# Patient Record
Sex: Female | Born: 1980 | Race: Asian | Hispanic: No | Marital: Married | Smoking: Current some day smoker
Health system: Southern US, Community
[De-identification: ages and names within clinical notes are randomized; demographics above are authoritative.]

## PROBLEM LIST (undated history)

## (undated) DIAGNOSIS — Z789 Other specified health status: Secondary | ICD-10-CM

## (undated) HISTORY — PX: NO PAST SURGERIES: SHX2092

---

## 1999-03-21 ENCOUNTER — Emergency Department (HOSPITAL_COMMUNITY): Admission: EM | Admit: 1999-03-21 | Discharge: 1999-03-21 | Payer: Self-pay | Admitting: Emergency Medicine

## 2002-06-09 ENCOUNTER — Encounter: Payer: Self-pay | Admitting: *Deleted

## 2002-06-09 ENCOUNTER — Ambulatory Visit (HOSPITAL_COMMUNITY): Admission: RE | Admit: 2002-06-09 | Discharge: 2002-06-09 | Payer: Self-pay | Admitting: *Deleted

## 2002-06-11 ENCOUNTER — Encounter: Admission: RE | Admit: 2002-06-11 | Discharge: 2002-06-11 | Payer: Self-pay | Admitting: *Deleted

## 2002-07-02 ENCOUNTER — Encounter: Admission: RE | Admit: 2002-07-02 | Discharge: 2002-07-02 | Payer: Self-pay | Admitting: *Deleted

## 2002-07-23 ENCOUNTER — Encounter: Admission: RE | Admit: 2002-07-23 | Discharge: 2002-07-23 | Payer: Self-pay | Admitting: *Deleted

## 2002-08-06 ENCOUNTER — Encounter: Admission: RE | Admit: 2002-08-06 | Discharge: 2002-08-06 | Payer: Self-pay | Admitting: *Deleted

## 2002-08-06 ENCOUNTER — Encounter (HOSPITAL_COMMUNITY): Admission: RE | Admit: 2002-08-06 | Discharge: 2002-09-05 | Payer: Self-pay | Admitting: *Deleted

## 2002-08-20 ENCOUNTER — Encounter: Admission: RE | Admit: 2002-08-20 | Discharge: 2002-08-20 | Payer: Self-pay | Admitting: *Deleted

## 2002-08-27 ENCOUNTER — Encounter: Admission: RE | Admit: 2002-08-27 | Discharge: 2002-08-27 | Payer: Self-pay | Admitting: *Deleted

## 2002-09-10 ENCOUNTER — Encounter: Admission: RE | Admit: 2002-09-10 | Discharge: 2002-09-10 | Payer: Self-pay | Admitting: *Deleted

## 2002-09-10 ENCOUNTER — Inpatient Hospital Stay (HOSPITAL_COMMUNITY): Admission: AD | Admit: 2002-09-10 | Discharge: 2002-09-17 | Payer: Self-pay | Admitting: *Deleted

## 2002-09-14 ENCOUNTER — Encounter: Payer: Self-pay | Admitting: Obstetrics and Gynecology

## 2002-09-24 ENCOUNTER — Encounter: Admission: RE | Admit: 2002-09-24 | Discharge: 2002-09-24 | Payer: Self-pay | Admitting: *Deleted

## 2002-10-08 ENCOUNTER — Encounter: Admission: RE | Admit: 2002-10-08 | Discharge: 2002-10-08 | Payer: Self-pay | Admitting: *Deleted

## 2002-10-17 ENCOUNTER — Observation Stay (HOSPITAL_COMMUNITY): Admission: AD | Admit: 2002-10-17 | Discharge: 2002-10-18 | Payer: Self-pay | Admitting: *Deleted

## 2002-10-17 ENCOUNTER — Encounter: Payer: Self-pay | Admitting: *Deleted

## 2002-10-19 ENCOUNTER — Inpatient Hospital Stay (HOSPITAL_COMMUNITY): Admission: AD | Admit: 2002-10-19 | Discharge: 2002-10-21 | Payer: Self-pay | Admitting: *Deleted

## 2005-09-12 ENCOUNTER — Ambulatory Visit (HOSPITAL_COMMUNITY): Admission: RE | Admit: 2005-09-12 | Discharge: 2005-09-12 | Payer: Self-pay | Admitting: *Deleted

## 2005-09-20 ENCOUNTER — Ambulatory Visit: Payer: Self-pay | Admitting: Family Medicine

## 2005-10-02 ENCOUNTER — Inpatient Hospital Stay (HOSPITAL_COMMUNITY): Admission: AD | Admit: 2005-10-02 | Discharge: 2005-10-02 | Payer: Self-pay | Admitting: *Deleted

## 2005-10-11 ENCOUNTER — Ambulatory Visit (HOSPITAL_COMMUNITY): Admission: RE | Admit: 2005-10-11 | Discharge: 2005-10-11 | Payer: Self-pay | Admitting: *Deleted

## 2005-10-11 ENCOUNTER — Ambulatory Visit: Payer: Self-pay | Admitting: Family Medicine

## 2005-10-25 ENCOUNTER — Ambulatory Visit: Payer: Self-pay | Admitting: *Deleted

## 2005-11-22 ENCOUNTER — Ambulatory Visit: Payer: Self-pay | Admitting: *Deleted

## 2005-12-12 ENCOUNTER — Ambulatory Visit: Payer: Self-pay | Admitting: Obstetrics & Gynecology

## 2005-12-19 ENCOUNTER — Ambulatory Visit: Payer: Self-pay | Admitting: Obstetrics & Gynecology

## 2005-12-19 ENCOUNTER — Ambulatory Visit (HOSPITAL_COMMUNITY): Admission: RE | Admit: 2005-12-19 | Discharge: 2005-12-19 | Payer: Self-pay | Admitting: Obstetrics & Gynecology

## 2005-12-26 ENCOUNTER — Ambulatory Visit: Payer: Self-pay | Admitting: *Deleted

## 2005-12-26 ENCOUNTER — Inpatient Hospital Stay (HOSPITAL_COMMUNITY): Admission: AD | Admit: 2005-12-26 | Discharge: 2005-12-28 | Payer: Self-pay | Admitting: *Deleted

## 2006-01-02 ENCOUNTER — Ambulatory Visit: Payer: Self-pay | Admitting: *Deleted

## 2006-01-09 ENCOUNTER — Ambulatory Visit: Payer: Self-pay | Admitting: Obstetrics & Gynecology

## 2006-01-23 ENCOUNTER — Ambulatory Visit: Payer: Self-pay | Admitting: Family Medicine

## 2006-02-06 ENCOUNTER — Ambulatory Visit: Payer: Self-pay | Admitting: *Deleted

## 2006-02-14 ENCOUNTER — Ambulatory Visit: Payer: Self-pay | Admitting: Gynecology

## 2006-02-21 ENCOUNTER — Ambulatory Visit: Payer: Self-pay | Admitting: *Deleted

## 2006-02-21 IMAGING — US US OB FOLLOW-UP
1 series · 13 of 28 positions shown · non-contrast
Comparison: none

CLINICAL DATA: Anatomic exam.  No current problems.

[Series 1: us ob follow-up · 0.16mm/px · 13 of 86 slices shown]
[im 4/86]
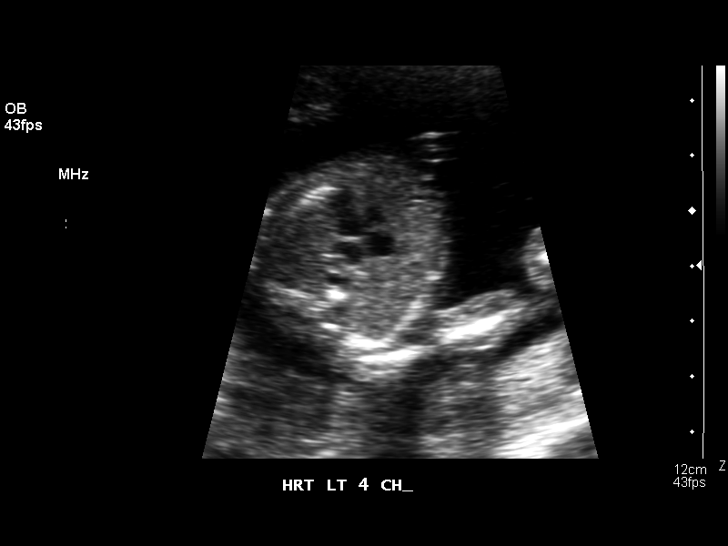
[im 10/86]
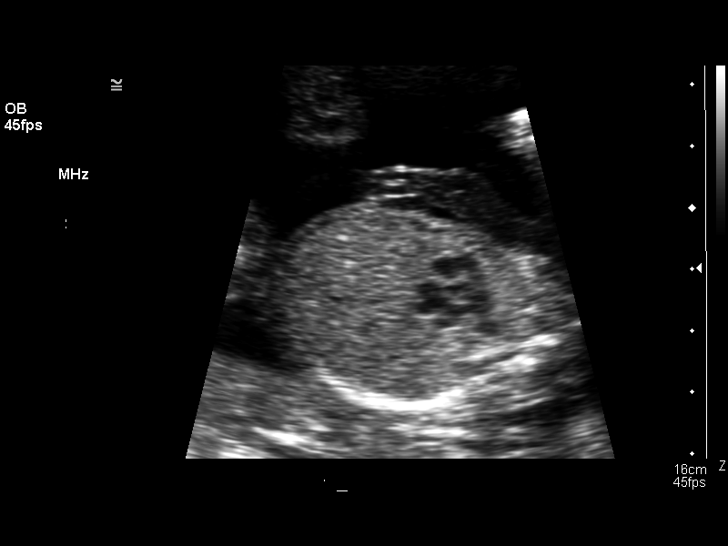
[im 16/86]
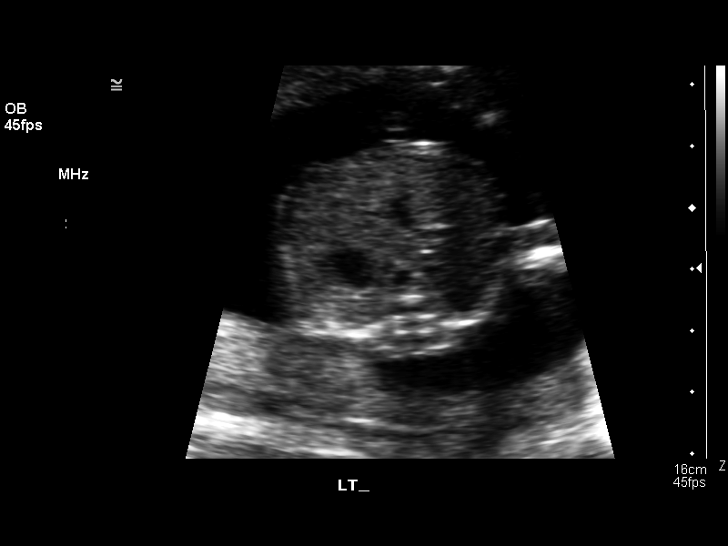
[im 23/86]
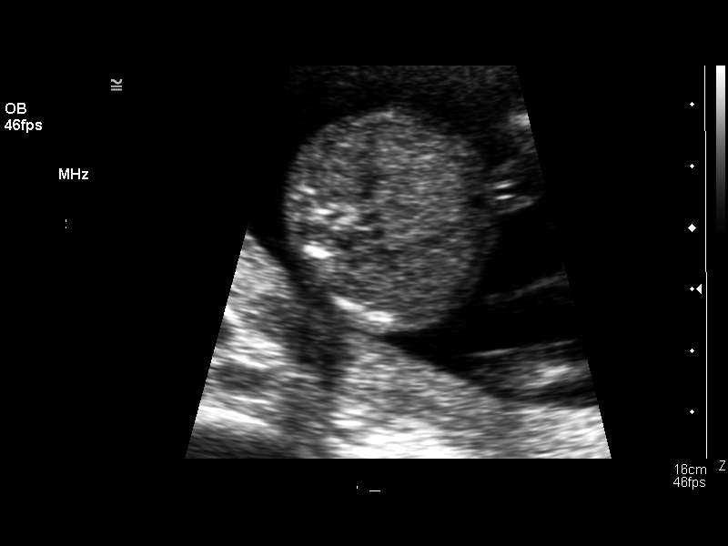
[im 29/86]
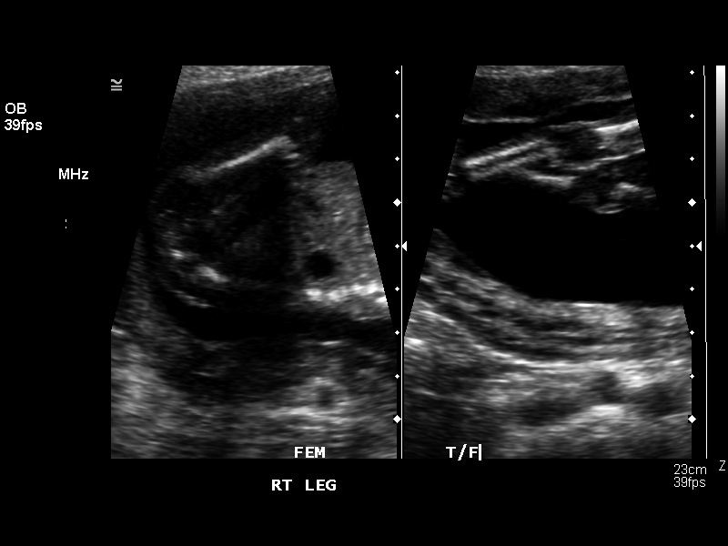
[im 35/86]
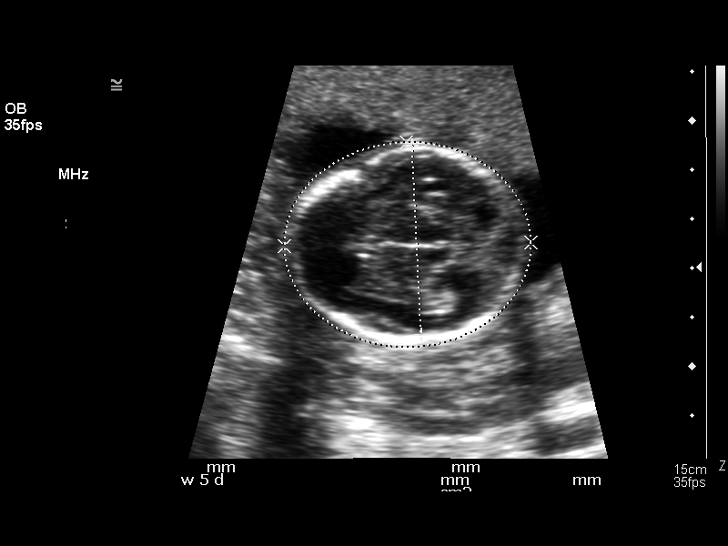
[im 45/86]
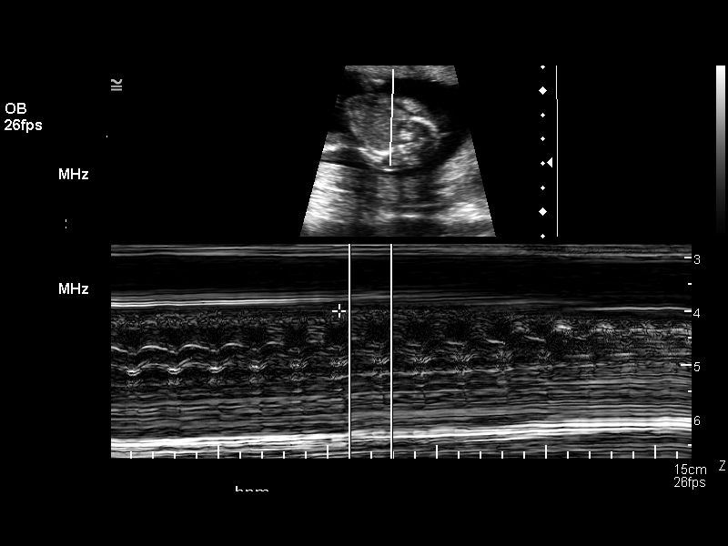
[im 51/86]
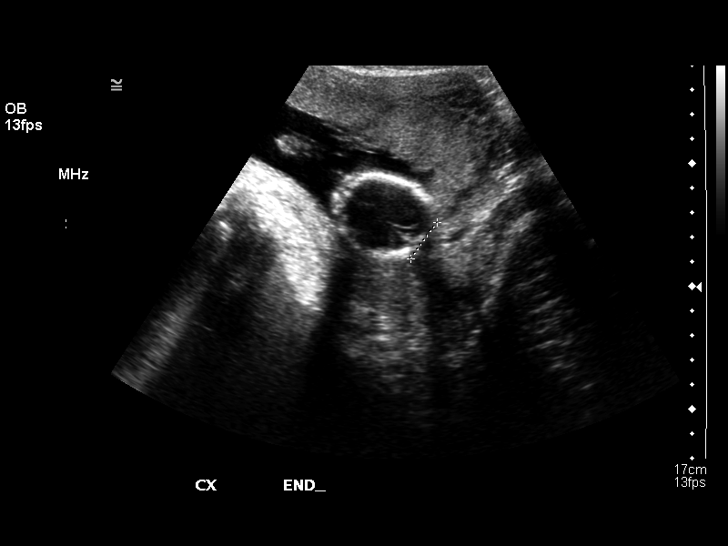
[im 57/86]
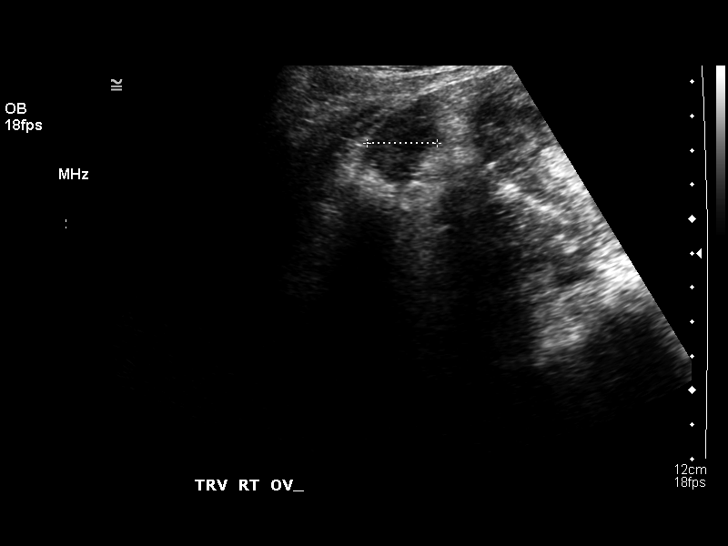
[im 63/86]
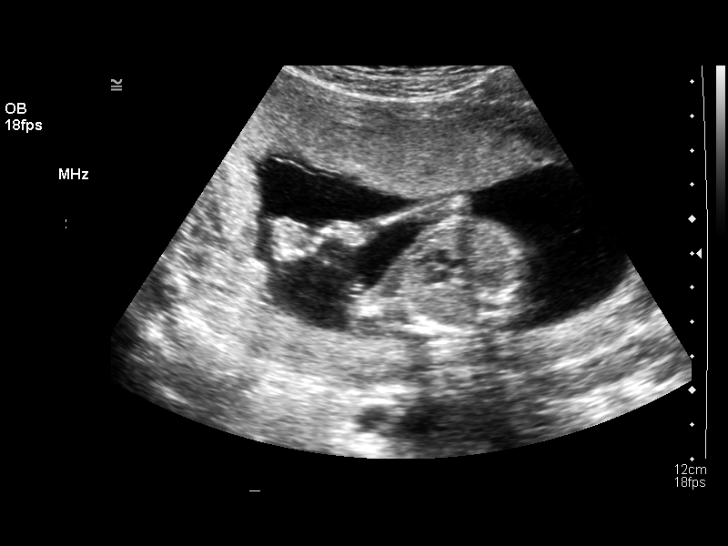
[im 70/86]
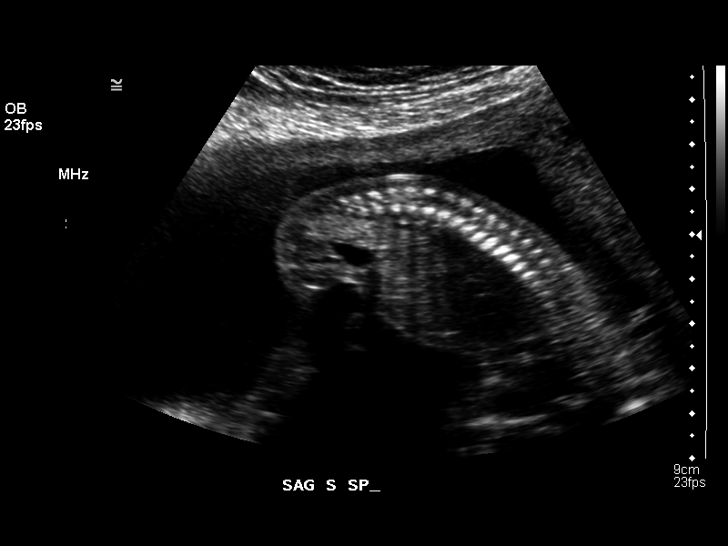
[im 76/86]
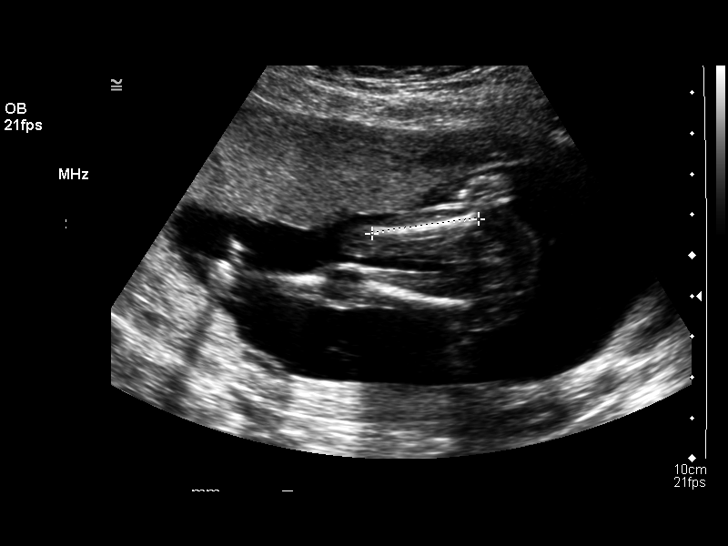
[im 82/86]
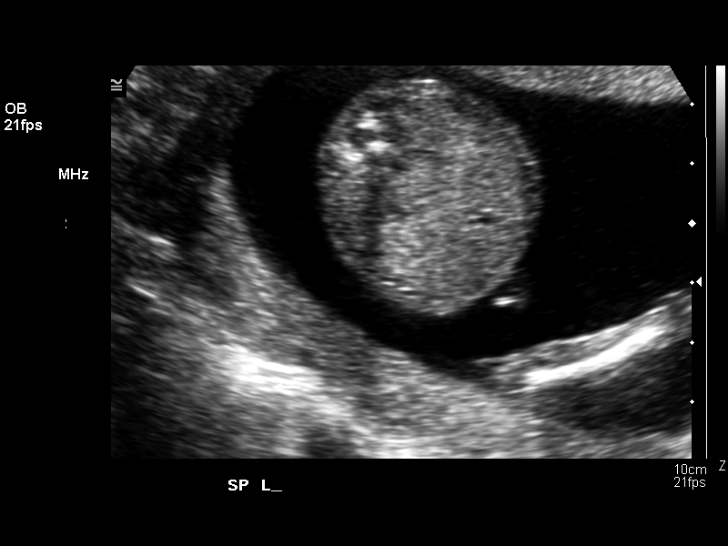

[13 of 28 positions shown; findings below may reference images not displayed]

OBSTETRICAL ULTRASOUND RE-EVALUATION:
Number of Fetuses: 1
Heart Rate:  157
Movement:  Yes
Breathing:  No
Presentation:  Cephalic
Placental Location:  Anterior, low-lying
Grade:  I
Previa:  No
Comments:  Currently the inferior margin of the anterior placenta lies 1.0 cm from the internal cervical os compatible with a low-lying position.
Amniotic Fluid (subjective):  Normal
Amniotic Fluid (objective):  4.2 cm Vertical pocket 

FETAL BIOMETRY
BPD:  3.9 cm   17 w 6 d
HC:  14.4 cm   17 w 5 d
AC:  11.8 cm  17 w 4 d
FL:  2.7 cm   18 w 2 d

Mean GA:  17 w 6 d
Assigned GA:  17 w 6 d

FETAL ANATOMY
Lateral Ventricles:  Visualized 
Thalami/CSP:  Visualized 
Posterior Fossa:  Visualized 
Nuchal Region:  Visualized 
Spine:    Visualized 
4 Chamber Heart on Left:  Visualized 
Stomach on Left:  Visualized 
3 Vessel Cord:  Visualized 
Cord Insertion Site:  Visualized 
Kidneys:  Visualized 
Bladder:  Visualized 
Extremities:  Visualized 

ADDITIONAL ANATOMY VISUALIZED:  LVOT, RVOT, upper lip, orbits, profile, diaphragm, heel, 5th digit, ductal arch, aortic arch, and female genitalia.

MATERNAL UTERINE AND ADNEXAL FINDINGS
Cervix:  3.0 cm Transabdominally.  Normal ovaries.
IMPRESSION: 1.  Single intrauterine pregnancy demonstrating an estimated gestational age by ultrasound of 17 weeks and 6 days.  Correlation with assigned gestational age by initial ultrasound of 17 weeks and 6 days suggests appropriate growth.  
2.  No focal anatomic abnormalities are noted with a good anatomic exam possible.
3.  Current low-lying placental position.  Follow-up is recommended at greater than 28 weeks to assess for appropriate placental location.
4.  The patient was sent with a preliminary report of today?s exam to a clinic visit immediately following this evaluation.

## 2006-02-28 ENCOUNTER — Ambulatory Visit: Payer: Self-pay | Admitting: *Deleted

## 2006-03-07 ENCOUNTER — Ambulatory Visit: Payer: Self-pay | Admitting: Gynecology

## 2006-03-13 ENCOUNTER — Ambulatory Visit: Payer: Self-pay | Admitting: Obstetrics and Gynecology

## 2006-03-13 ENCOUNTER — Inpatient Hospital Stay (HOSPITAL_COMMUNITY): Admission: AD | Admit: 2006-03-13 | Discharge: 2006-03-15 | Payer: Self-pay | Admitting: Gynecology

## 2006-05-08 IMAGING — US US OB FOLLOW-UP
1 series · 13 of 28 positions shown · non-contrast
Comparison: 12/19/05.

CLINICAL DATA: Preterm labor.  Evaluate growth.

 OBSTETRICAL ULTRASOUND RE-EVALUATION:

[Series 1: us ob follow-up · 0.43mm/px · 13 of 32 slices shown]
[im 2/32]
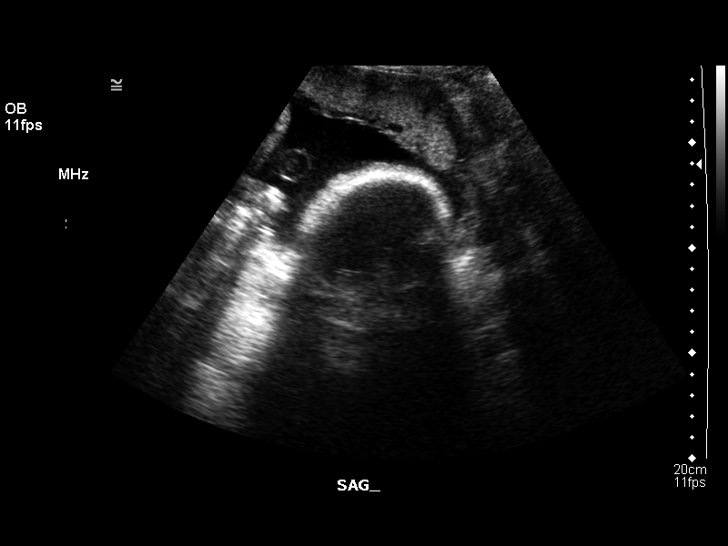
[im 4/32]
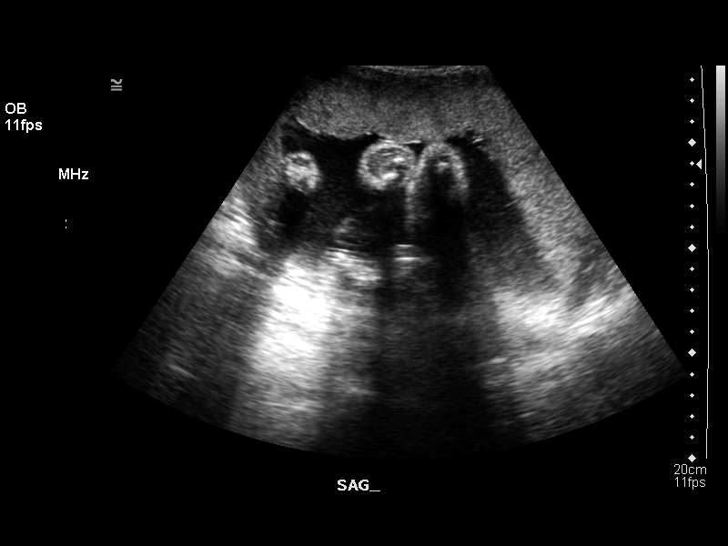
[im 6/32]
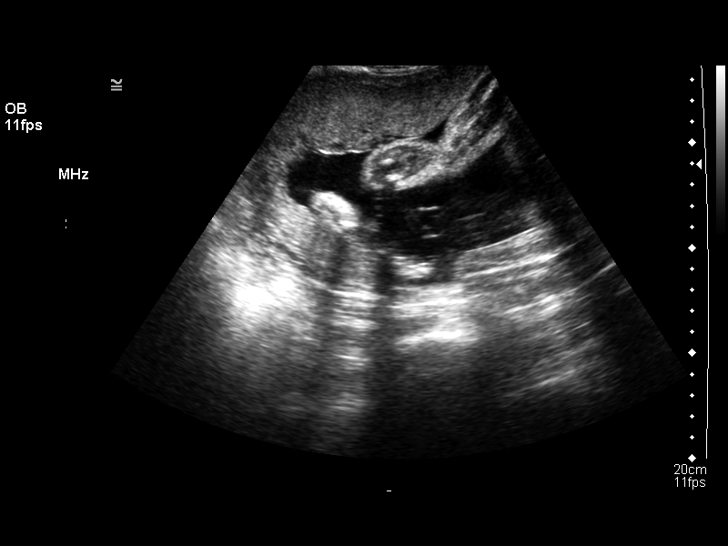
[im 9/32]
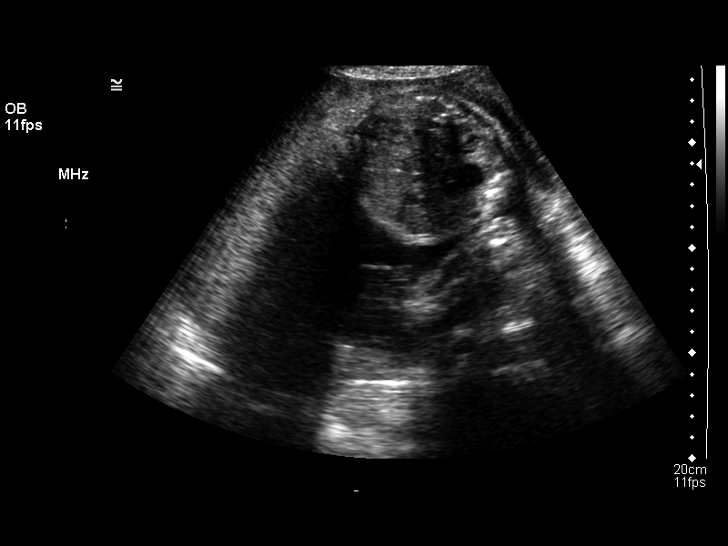
[im 11/32]
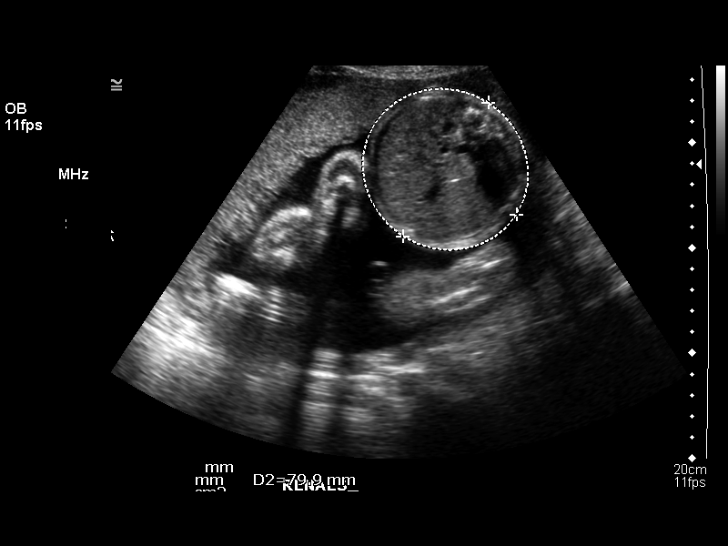
[im 13/32]
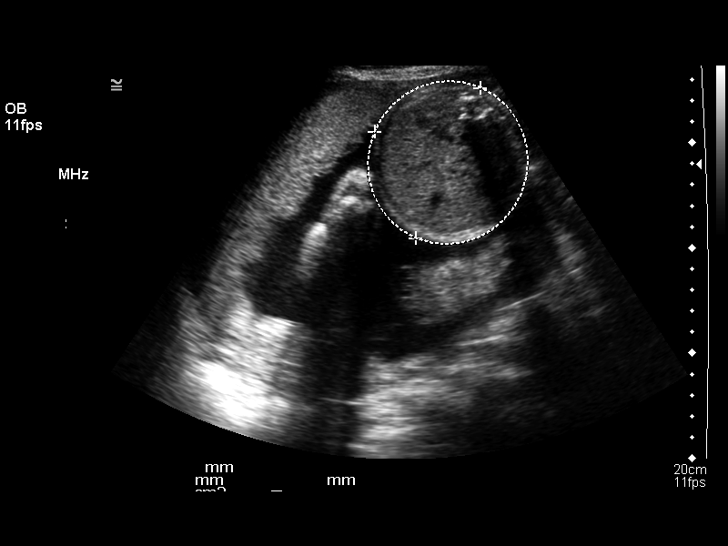
[im 17/32]
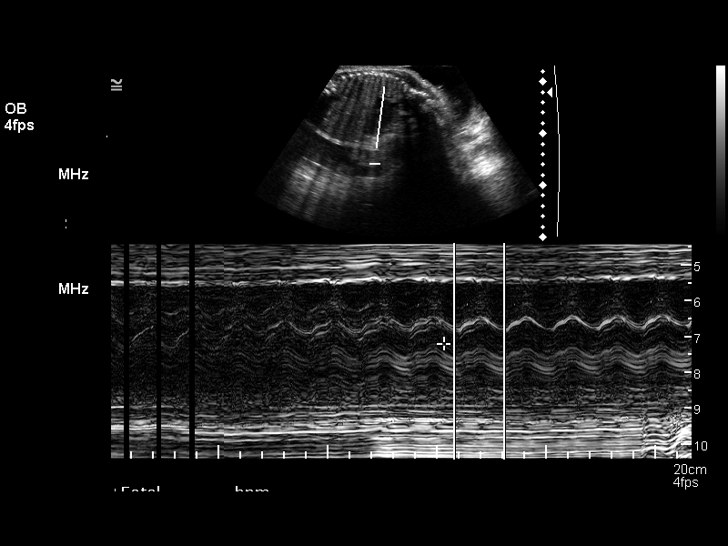
[im 19/32]
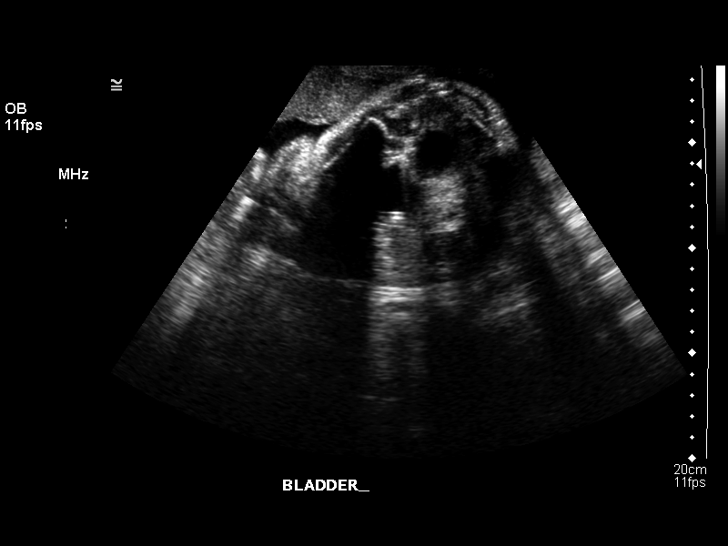
[im 21/32]
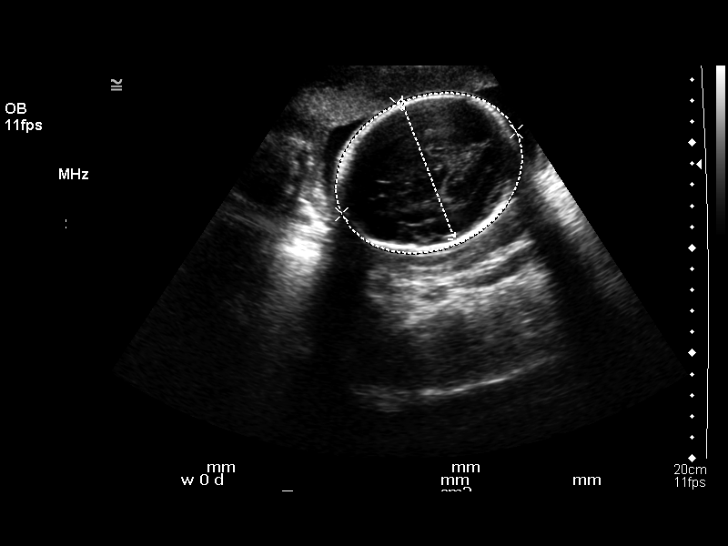
[im 23/32]
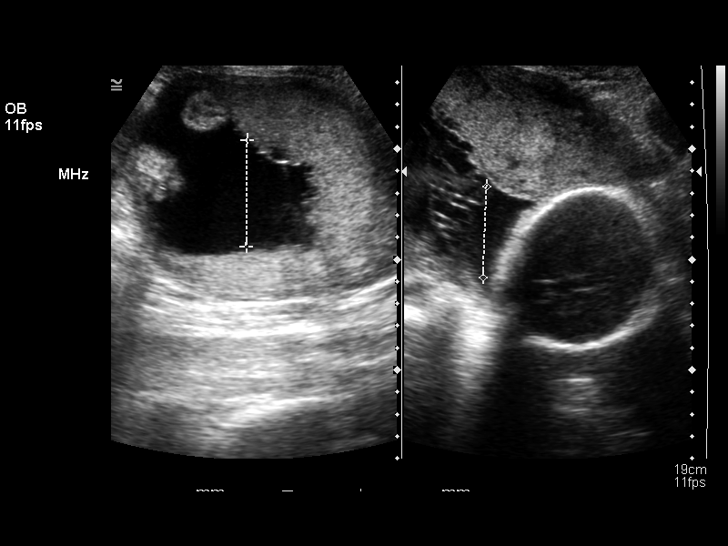
[im 26/32]
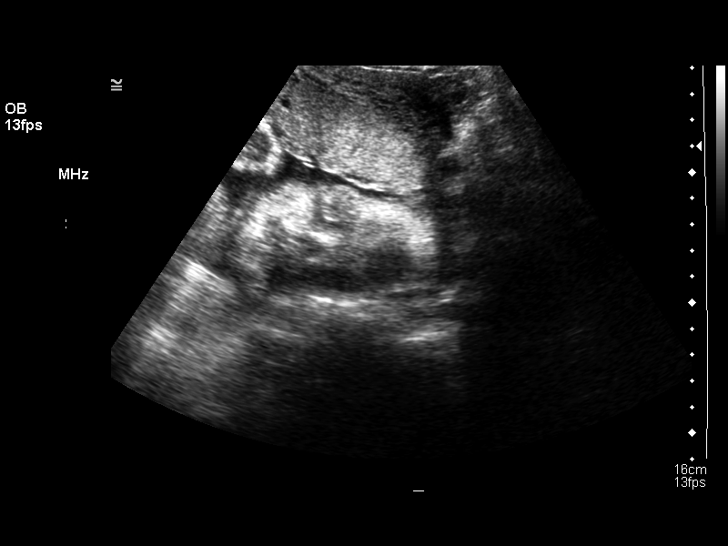
[im 28/32]
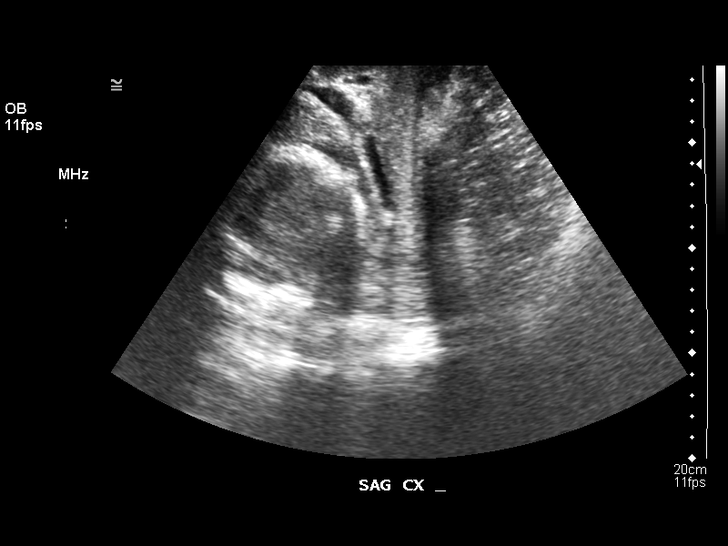
[im 30/32]
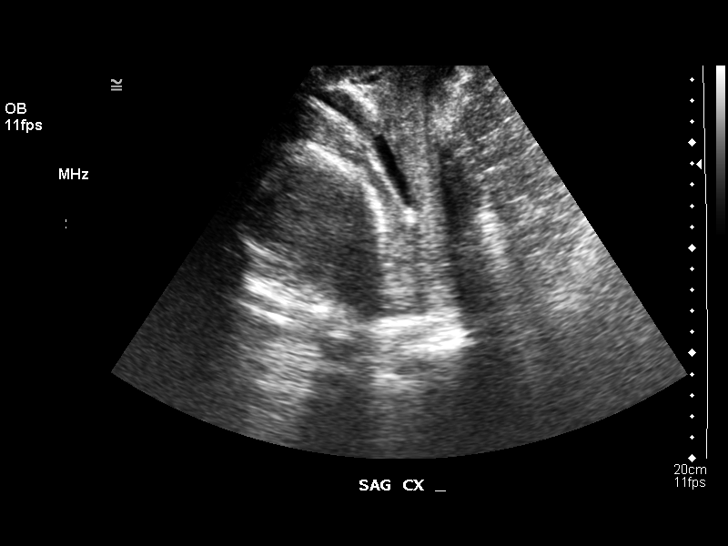

[13 of 28 positions shown; findings below may reference images not displayed]

Number of Fetuses:  1
 Heart Rate:  131
 Movement:  Yes
 Breathing:  No
 Presentation:  Cephalic
 Placental Location:  Anterior
 Grade:  I
 Previa:  No
 Amniotic Fluid (subjective):  Normal
 Amniotic Fluid (objective):  17.1 cm AFI (5th -95th%ile = 9.2 – 23.1 cm for 29 wks)

 FETAL BIOMETRY
 BPD:  7.0 cm   28 w 0 d
 HC:  26.0 cm   28 w 2 d
 AC:   24.4 cm   28 w 4 d
 FL:  3.4 cm  28 w 3 d

 Mean GA:  28 w 2 d  US EDC:  03/18/06
 Assigned GA:  28 w 5 d  Assigned EDC:  03/15/06

 EFW:  8290 g (H) 50th – 75th%ile (4448 – 5898 g) For 29 wks

 FETAL ANATOMY
 Lateral Ventricles:  Visualized 
 Thalami/CSP:  Previously seen   
 Posterior Fossa:  Previously seen 
 Nuchal Region:  Previously seen 
 Spine:  Previously seen 
 4 Chamber Heart on Left:  Previously seen 
 Stomach on Left:  Visualized 
 3 Vessel Cord:  Previously seen 
 Cord Insertion Site:  Previously seen 
 Kidneys:  Visualized 
 Bladder:  Visualized 
 Extremities:  Previously seen 

 ADDITIONAL ANATOMY VISUALIZED:  Diaphragm and female genitalia.

 MATERNAL UTERINE AND ADNEXAL FINDINGS
 Cervix:  3.3 cm Translabially
IMPRESSION: 1.  Single living intrauterine fetus in cephalic presentation with subjectively and quantitatively normal amniotic fluid volume.
 2.  Estimated mean gestational age by ultrasound today is 28 weeks 2 days with good concordance of the fetal biometric parameters.  This suggests appropriate interval growth when comparing back to ultrasound exams from 09/12/05 and 10/11/05.  
 3.  Cervical length is 3.3 cm translabially

## 2010-08-31 ENCOUNTER — Ambulatory Visit: Payer: Self-pay | Admitting: Internal Medicine

## 2010-08-31 ENCOUNTER — Encounter: Payer: Self-pay | Admitting: Physician Assistant

## 2010-09-15 ENCOUNTER — Ambulatory Visit: Payer: Self-pay | Admitting: Physician Assistant

## 2010-09-15 ENCOUNTER — Encounter (INDEPENDENT_AMBULATORY_CARE_PROVIDER_SITE_OTHER): Payer: Self-pay | Admitting: *Deleted

## 2010-09-15 LAB — CONVERTED CEMR LAB
Blood in Urine, dipstick: NEGATIVE
Glucose, Urine, Semiquant: NEGATIVE
Ketones, urine, test strip: NEGATIVE

## 2010-12-21 ENCOUNTER — Ambulatory Visit: Admit: 2010-12-21 | Payer: Self-pay | Admitting: Obstetrics & Gynecology

## 2010-12-31 ENCOUNTER — Encounter: Payer: Self-pay | Admitting: *Deleted

## 2011-01-09 NOTE — Letter (Signed)
Summary: *HSN Results Follow up  [facility]  [address] [place], [place] [address]   Phone: [phone]  Fax: [phone]      09/15/2010   [facility] [address] [place], [place]  [address]   Dear  Ms. Royanne Foots,                            ____S.Drinkard,FNP   ____D. Gore,FNP       ____B. McPherson,MD   ____V. Rankins,MD    ____E. Mulberry,MD    ____N. Daphine Deutscher, FNP  ____D. Reche Dixon, MD    ____K. Philipp Deputy, MD    ____Other     This letter is to inform you that your recent test(s):  _______Pap Smear    __X_____Lab Test     _______X-ray    ___X____ is within acceptable limits  _______ requires a medication change  _______ requires a follow-up lab visit  _______ requires a follow-up visit with your provider   Comments: Your urine test is good.       _________________________________________________________ If you have any questions, please contact our office                     Sincerely,  Arm[place] Shannon [facility]

## 2011-01-09 NOTE — Letter (Signed)
Summary: PT INFORMATION SHEET  PT INFORMATION SHEET   Imported By: Arta Bruce 09/01/2010 12:17:36  _____________________________________________________________________  External Attachment:    Type:   Image     Comment:   External Document

## 2011-01-09 NOTE — Assessment & Plan Note (Signed)
Summary: New Patient   Vital Signs:  Patient profile:   30 year old female Weight:      146.2 pounds Temp:     97.5 degrees F oral Pulse rate:   68 / minute Pulse rhythm:   regular Resp:     20 per minute BP sitting:   112 / 62  (left arm) Cuff size:   large  Vitals Entered By: CMA Studen Linzie Collin CC: New patient visit,no current issues,no Rx taken Is Patient Diabetic? No Pain Assessment Patient in pain? no       Does patient need assistance? Functional Status Self care   Primary Care Denene Alamillo:  Tereso Newcomer, PA-C  CC:  New patient visit, no current issues, and no Rx taken.  History of Present Illness: New patient. [facility]   Can't remember when last Pap was done . . . .? 2009 or 2010. Has IUD.  IUD placed 2007.  Due to get out in May 2011.    Habits & Providers  Alcohol-Tobacco-Diet     Tobacco Status: never  Exercise-Depression-Behavior     Drug Use: no  Allergies (verified): No Known Drug Allergies  Past History:  Past Medical History: Unremarkable  Past Surgical History: labial cyst drained  Family History: Family History Hypertension - Mom Mom had h/p LTBI no breast or colon CA Family History Diabetes 1st degree relative - mom  Social History: Single 3 kids Never Smoked Alcohol use-no Drug use-no Smoking Status:  never Drug Use:  no  Review of Systems      See HPI General:  Denies chills and fever. CV:  Denies chest pain or discomfort. Resp:  Denies cough. GI:  Denies bloody stools and dark tarry stools. GU:  Denies dysuria and urinary frequency. Endo:  Denies polyuria.  Physical Exam  General:  alert, well-developed, and well-nourished.   Head:  normocephalic and atraumatic.   Eyes:  pupils equal, pupils round, and pupils reactive to light.   Ears:  R ear normal and L ear normal.   Mouth:  pharynx pink and moist.   Neck:  supple.   Lungs:  normal breath sounds.   Heart:  normal rate and  regular rhythm.   Abdomen:  soft and non-tender.   Msk:  normal ROM.   Neurologic:  alert & oriented X3 and cranial nerves II-XII intact.   Psych:  normally interactive.     Impression & Recommendations:  Problem # 1:  FAMILY PLANNING (ICD-V25.09)  refer to gyn for IUD removal  Orders: Gynecologic Referral (Gyn)  Problem # 2:  Preventive Health Care (ICD-V70.0)  check u/a arrange pap with gyn at time of IUD get records from [facility]  Orders: T-Urinalysis ([id number])  Patient Instructions: 1)  [facility] [place] 2)  Td shot today. 3)  Call to schedule annual CPP next year (one year after done by [facility]).  Appended Document: New Patient   Tetanus/Td Vaccine    Vaccine Type: Tdap    Site: right deltoid    Mfr: Sanofi Pasteur    Dose: 0.5 ml    Route: IM    Given by: [place] Shannon    Exp. Date: 12/22/2012    Lot #: [id number]    VIS given: 10/27/08 version given August 31, 2010.   Appended Document: New Patient  Laboratory Results   Urine Tests  Date/Time Received: August 31, 2010 12:28 PM   Routine Urinalysis  Glucose: negative   (Normal Range: Negative) Bilirubin: negative   (Normal Range: Negative) Ketone: negative   (Normal Range: Negative) Spec. Gravity: 1.020   (Normal Range: 1.003-1.035) Blood: negative   (Normal Range: Negative) pH: 5.5   (Normal Range: 5.0-8.0) Protein: negative   (Normal Range: Negative) Urobilinogen: negative   (Normal Range: 0-1) Nitrite: negative   (Normal Range: Negative) Leukocyte Esterace: small   (Normal Range: Negative)      Just seeing this today. She denied any symptoms of dysuria. Confirm this. Repeat u/a in 2 weeks if no symptoms; today if + symptoms. Tereso Newcomer PA-C  September 01, 2010 9:09 AM  pt is aware

## 2011-04-27 NOTE — Discharge Summary (Signed)
Jocelyn Bennett, Jocelyn Bennett                            ACCOUNT NO.:  [id number]   MEDICAL RECORD NO.:  [id number]                   PATIENT TYPE:  INP   LOCATION:  9153                                 [facility]ACILITY:  [facility]   PHYSICIAN:  Mary Sella. Orlene Erm, M.D.                 DATE OF BIRTH:  Apr 23, 1981   DATE OF ADMISSION:  09/10/2002  DATE OF DISCHARGE:  09/17/2002                                 DISCHARGE SUMMARY   DISCHARGE DIAGNOSES:  1. Intrauterine pregnancy at 31 and [redacted] weeks gestation.  2. Preterm labor.  3. History of a preterm delivery at [redacted] weeks gestation.   DISCHARGE MEDICATIONS:  1. Terbutaline 2.5 mg p.o. q.3h. p.r.n. UCs.  2. Prenatal vitamins one p.o. q.d.   FOLLOW UP:  The patient is [facility]  Thursday, September 25, 2002.  Additionally, if the terbutaline does not help  with the resolution of her UCs she should return to the maternity admissions  unit at [facility].   HISTORY OF PRESENT ILLNESS:  A 30 year old G2, P0-1-0-1 at 11 and [redacted] weeks  gestation who presented with pressure.  The [facility] and does have a history of preterm labor and preterm delivery at  35 weeks.  Vaginal examination by Dr. Gavin Potters showed a tight fingertip  cervix 60% effaced with a developing lower uterine segment.   PRENATAL LABORATORIES:  B+.  Antibody negative.  Rubella immune.  Hepatitis  B surface antigen negative.  RPR negative.  HIV negative.  GC negative.   HOSPITAL COURSE:  The patient was initially admitted for observation and  fetal monitoring.  She did develop increased UCs and was given subcutaneous  terbutaline 0.25 mg which was effective in this patient.  Eventually, the  patient was changed to p.o. terbutaline 5 mg and developed maternal  tachycardia.  Therefore, the tocolytic was discontinued.  The patient was  started on Procardia XL b.i.d. which did not help with her UCs.  She was  then started on magnesium  sulfate which she stayed on for approximately 24  hours.  She returned to Procardia and continued to have uterine  contractility and therefore was discontinued off the Procardia and started  on terbutaline 2.5 mg p.o. q.3h.  She did not have any resulting maternal  tachycardia and was discharged to home with p.r.n. dosing of this  medication.  Social:  The patient will have to be on strict bed rest when  she goes home with only bathroom privileges.  Social work did see this  patient and evaluate her willingness to abide by strict bed rest.  That note  is documented in the chart and patient states that she will be able to do  so.  The patient was discharged to home without further incident.   DISCHARGE LABORATORIES:  OB ultrasound on September 10, 2002 shows single IUP,  heart rate 150, cephalic presentation, posterior placenta, no previa, grade  2, normal AFI, BPP 6/8, cervical length 4 cm translabially.  Repeat  ultrasound on September 14, 2002 shows single gestation, cephalic presentation,  posterior placenta, no previa, grade 1, normal AFI, BPP 6/8, cervical length  3.1 cm transabdominally.  WBC 12.0, hemoglobin 10.7, platelets 233,000.  Sodium 136, potassium 3.3, chloride 107, CO2 23, glucose 93, BUN 6,  creatinine 0.6, calcium 7.1, total protein 6.0, albumin 2.6, AST 13, ALT  less than 19, alkaline phosphatase 113, total bilirubin 0.2, LDH 108,  magnesium 4.5, uric acid 3.4.  RPR nonreactive.  Fetal fibronectin negative  on September 12, 2002.     Jonah Blue, M.D.                      Mary Sella. Orlene Erm, M.D.    Milas Gain  D:  09/17/2002  T:  09/18/2002  Job:  [id number]   cc:   [facility]

## 2011-04-27 NOTE — Discharge Summary (Signed)
NAMEJENESE, Jocelyn Bennett                ACCOUNT NO.:  [id number]   MEDICAL RECORD NO.:  [id number]          PATIENT TYPE:  INP   LOCATION:  9156                          [facility]ILITY:  [facility]   PHYSICIAN:  Tracy L. Mayford Knife, M.D.DATE OF BIRTH:  1981/08/12   DATE OF ADMISSION:  12/26/2005  DATE OF DISCHARGE:  12/28/2005                                 DISCHARGE SUMMARY   REASON FOR ADMISSION:  Preterm cervical change.   DISCHARGE DIAGNOSES:  1.  Preterm cervical change.  2.  Bacterial vaginosis.   IMAGING:  Ultrasound on December 26, 2005 showed cephalic female infant with  a grade 1 placenta that is anterior.  Amniotic fluid 17.1 cm (normal).  Mean  gestational age [redacted] weeks 2 days.  Estimated fetal weight 1200-1234 (50-75th  percentile).  Cervix 3.3 cm.   LABORATORIES:  Group B Strep negative.  Wet prep some clue cells.   HOSPITAL COURSE:  Patient is a 30 year old G3, P0-2-0-2 at 5 weeks dated by  a 14-week ultrasound who was a direct admit from clinic.  She presented with  a three-day history of tightening of her uterus approximately three times an  hour that decreased to approximately one time an hour.  Dr. Gavin Potters checked  her cervix in the clinic and felt that she was dilated to 1 and was  concerned that she was in preterm labor.  Wet prep and GBS were done in the  clinic, results as above.  Patient was admitted, started on Procardia 10 mg  p.o. q.6h., Unasyn and she also received two doses of betamethasone while in  the hospital.  Fetal heart tracing was always reactive and reassuring.  Contractions decreased in frequency.  On the date of discharge her cervix  was found to be a very tight 1, approximately 30% effaced, and -3.  She was  discharged to home in stable condition.   FOLLOW-UP:  [facility] one [facility].   DIET:  Regular.   ACTIVITY:  Bed rest with bathroom privileges.   MEDICATIONS:  1.  Prenatal vitamins one p.o. daily.  2.  Procardia XL 30 mg one tablet  q.12h.   DISCHARGE INSTRUCTIONS:  Patient was counseled on signs and symptoms of  preterm labor.           ______________________________  Marc Morgans Mayford Knife, M.D.    TLW/MEDQ  D:  12/28/2005  T:  12/28/2005  Job:  [id number]

## 2013-10-01 ENCOUNTER — Encounter (HOSPITAL_COMMUNITY): Payer: Self-pay | Admitting: Emergency Medicine

## 2013-10-01 ENCOUNTER — Emergency Department (INDEPENDENT_AMBULATORY_CARE_PROVIDER_SITE_OTHER)
Admission: EM | Admit: 2013-10-01 | Discharge: 2013-10-01 | Disposition: A | Discharge status: Home / Self Care | Payer: Self-pay | Source: Home / Self Care | Attending: Family Medicine | Admitting: Family Medicine

## 2013-10-01 DIAGNOSIS — S43499A Other sprain of unspecified shoulder joint, initial encounter: Secondary | ICD-10-CM

## 2013-10-01 DIAGNOSIS — S46812A Strain of other muscles, fascia and tendons at shoulder and upper arm level, left arm, initial encounter: Secondary | ICD-10-CM

## 2013-10-01 DIAGNOSIS — S139XXA Sprain of joints and ligaments of unspecified parts of neck, initial encounter: Secondary | ICD-10-CM

## 2013-10-01 DIAGNOSIS — S161XXA Strain of muscle, fascia and tendon at neck level, initial encounter: Secondary | ICD-10-CM

## 2013-10-01 DIAGNOSIS — S2000XA Contusion of breast, unspecified breast, initial encounter: Secondary | ICD-10-CM

## 2013-10-01 DIAGNOSIS — R51 Headache: Secondary | ICD-10-CM

## 2013-10-01 DIAGNOSIS — S2002XA Contusion of left breast, initial encounter: Secondary | ICD-10-CM
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
## 2013-10-01 NOTE — ED Provider Notes (Signed)
Medical screening examination/treatment/procedure(s) were performed by a resident physician or non-physician practitioner and as the supervising physician I was immediately available for consultation/collaboration.  Kamrin Spath, MD    Naina Sleeper S Teancum Brule, MD 10/01/13 1658 
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
## 2013-10-01 NOTE — ED Notes (Signed)
Instructed to put on gown for physician evaluation

## 2013-10-01 NOTE — ED Notes (Signed)
Patient has paperwork to be completed, unable to discharge at this time

## 2018-12-10 DIAGNOSIS — O039 Complete or unspecified spontaneous abortion without complication: Secondary | ICD-10-CM

## 2018-12-10 HISTORY — DX: Complete or unspecified spontaneous abortion without complication: O03.9

## 2020-06-10 ENCOUNTER — Inpatient Hospital Stay (HOSPITAL_COMMUNITY): Payer: BC Managed Care – PPO

## 2020-06-10 ENCOUNTER — Encounter (HOSPITAL_COMMUNITY): Payer: Self-pay | Admitting: Family Medicine

## 2020-06-10 ENCOUNTER — Other Ambulatory Visit: Payer: Self-pay

## 2020-06-10 ENCOUNTER — Inpatient Hospital Stay (HOSPITAL_COMMUNITY)
Admission: AD | Admit: 2020-06-10 | Discharge: 2020-06-10 | Disposition: A | Discharge status: Home / Self Care | Payer: BC Managed Care – PPO | Attending: Family Medicine | Admitting: Family Medicine

## 2020-06-10 DIAGNOSIS — Z87891 Personal history of nicotine dependence: Secondary | ICD-10-CM | POA: Insufficient documentation

## 2020-06-10 DIAGNOSIS — O99891 Other specified diseases and conditions complicating pregnancy: Secondary | ICD-10-CM | POA: Diagnosis not present

## 2020-06-10 DIAGNOSIS — O23591 Infection of other part of genital tract in pregnancy, first trimester: Secondary | ICD-10-CM | POA: Diagnosis not present

## 2020-06-10 DIAGNOSIS — N760 Acute vaginitis: Secondary | ICD-10-CM | POA: Diagnosis not present

## 2020-06-10 DIAGNOSIS — B9689 Other specified bacterial agents as the cause of diseases classified elsewhere: Secondary | ICD-10-CM | POA: Diagnosis not present

## 2020-06-10 DIAGNOSIS — Z3A08 8 weeks gestation of pregnancy: Secondary | ICD-10-CM | POA: Diagnosis not present

## 2020-06-10 DIAGNOSIS — Z3490 Encounter for supervision of normal pregnancy, unspecified, unspecified trimester: Secondary | ICD-10-CM

## 2020-06-10 DIAGNOSIS — O209 Hemorrhage in early pregnancy, unspecified: Secondary | ICD-10-CM

## 2020-06-10 DIAGNOSIS — O4690 Antepartum hemorrhage, unspecified, unspecified trimester: Secondary | ICD-10-CM

## 2020-06-10 LAB — CBC
HCT: 36.4 % (ref 36.0–46.0)
Hemoglobin: 12 g/dL (ref 12.0–15.0)
MCH: 27.8 pg (ref 26.0–34.0)
MCHC: 33 g/dL (ref 30.0–36.0)
MCV: 84.5 fL (ref 80.0–100.0)
Platelets: 347 10*3/uL (ref 150–400)
RBC: 4.31 MIL/uL (ref 3.87–5.11)
RDW: 12.7 % (ref 11.5–15.5)
WBC: 7.2 10*3/uL (ref 4.0–10.5)
nRBC: 0 % (ref 0.0–0.2)

## 2020-06-10 LAB — HCG, QUANTITATIVE, PREGNANCY: hCG, Beta Chain, Quant, S: 26515 m[IU]/mL — ABNORMAL HIGH (ref <5)

## 2020-06-10 LAB — WET PREP, GENITAL
Sperm: NONE SEEN
Trich, Wet Prep: NONE SEEN
Yeast Wet Prep HPF POC: NONE SEEN

## 2020-06-10 LAB — ABO/RH: ABO/RH(D): B POS

## 2020-06-10 LAB — POC URINE PREG, ED: Preg Test, Ur: POSITIVE — AB

## 2020-06-10 MED ORDER — METRONIDAZOLE 500 MG PO TABS: 500.0000 mg | ORAL_TABLET | Freq: Two times a day (BID) | ORAL | 0 refills | Status: DC

## 2020-06-10 NOTE — MAU Note (Signed)
Pt reports having some red bleeding when she wiped yesterday and today it is a little heavier and bled through her pants at work.  Reports some left sided lower abdominal pain that is worse in the morning or when sitting and improves after being up and walking around.  Was a 7/10 waking up this AM, now is a 2/10.  Pain is described as "sore" and "constant."

## 2020-06-10 NOTE — ED Provider Notes (Signed)
MSE was initiated and I personally evaluated the patient and placed orders (if any) at  4:44 PM on June 10, 2020.  The patient appears stable so that the remainder of the MSE may be completed by another provider.  Patient placed in Quick Look pathway, seen and evaluated   Chief Complaint: vaginal bleeding  HPI:   39yo G4 female presenting to the ED with a chief complaint of vaginal bleeding in pregnancy. Patient reports [facility] a [facility]. She reports having L sided abdominal pain for the past few days. Started noticing vaginal bleeding yesterday when she wipes. This persisted today. Reports her prior 3 pregnancies resulted in premature births.  ROS: vaginal bleeding (one)  Physical Exam:   Gen: No distress  Neuro: Awake and Alert  Skin: Warm    Focused Exam: generalized tenderness without guarding.  No documented pregnancy test in the system. Will obtain pregnancy test and if negative, will transfer to MAU.  5:54 PM Pregnancy test is positive.  I have called over to MAU who accepts the patient in transfer. She is hemodynamically stable at this time.  Initiation of care has begun. The patient has been counseled on the process, plan, and necessity for staying for the completion/evaluation, and the remainder of the medical screening examination    Dietrich Pates, PA-C 06/10/20 1754    Tilden Fossa, MD 06/10/20 [phone]

## 2020-06-10 NOTE — Discharge Instructions (Signed)
Bacterial Vaginosis  Bacterial vaginosis is a vaginal infection that occurs when the normal balance of bacteria in the vagina is disrupted. It results from an overgrowth of certain bacteria. This is the most common vaginal infection among women ages 15-44. Because bacterial vaginosis increases your risk for STIs (sexually transmitted infections), getting treated can help reduce your risk for chlamydia, gonorrhea, herpes, and HIV (human immunodeficiency virus). Treatment is also important for preventing complications in pregnant women, because this condition can cause an early (premature) delivery. What are the causes? This condition is caused by an increase in harmful bacteria that are normally present in small amounts in the vagina. However, the reason that the condition develops is not fully understood. What increases the risk? The following factors may make you more likely to develop this condition:  Having a new sexual partner or multiple sexual partners.  Having unprotected sex.  Douching.  Having an intrauterine device (IUD).  Smoking.  Drug and alcohol abuse.  Taking certain antibiotic medicines.  Being pregnant. You cannot get bacterial vaginosis from toilet seats, bedding, swimming pools, or contact with objects around you. What are the signs or symptoms? Symptoms of this condition include:  Grey or white vaginal discharge. The discharge can also be watery or foamy.  A fish-like odor with discharge, especially after sexual intercourse or during menstruation.  Itching in and around the vagina.  Burning or pain with urination. Some women with bacterial vaginosis have no signs or symptoms. How is this diagnosed? This condition is diagnosed based on:  Your medical history.  A physical exam of the vagina.  Testing a sample of vaginal fluid under a microscope to look for a large amount of bad bacteria or abnormal cells. Your health care provider may use a cotton swab or  a small wooden spatula to collect the sample. How is this treated? This condition is treated with antibiotics. These may be given as a pill, a vaginal cream, or a medicine that is put into the vagina (suppository). If the condition comes back after treatment, a second round of antibiotics may be needed. Follow these instructions at home: Medicines  Take over-the-counter and prescription medicines only as told by your health care provider.  Take or use your antibiotic as told by your health care provider. Do not stop taking or using the antibiotic even if you start to feel better. General instructions  If you have a female sexual partner, tell her that you have a vaginal infection. She should see her health care provider and be treated if she has symptoms. If you have a female sexual partner, he does not need treatment.  During treatment: ? Avoid sexual activity until you finish treatment. ? Do not douche. ? Avoid alcohol as directed by your health care provider. ? Avoid breastfeeding as directed by your health care provider.  Drink enough water and fluids to keep your urine clear or pale yellow.  Keep the area around your vagina and rectum clean. ? Wash the area daily with warm water. ? Wipe yourself from front to back after using the toilet.  Keep all follow-up visits as told by your health care provider. This is important. How is this prevented?  Do not douche.  Wash the outside of your vagina with warm water only.  Use protection when having sex. This includes latex condoms and dental dams.  Limit how many sexual partners you have. To help prevent bacterial vaginosis, it is best to have sex with just one partner (  monogamous).  Make sure you and your sexual partner are tested for STIs.  Wear cotton or cotton-lined underwear.  Avoid wearing tight pants and pantyhose, especially during summer.  Limit the amount of alcohol that you drink.  Do not use any products that contain  nicotine or tobacco, such as cigarettes and e-cigarettes. If you need help quitting, ask your health care provider.  Do not use illegal drugs. Where to find more information  Centers for Disease Control and Prevention: www.cdc.gov/std  American Sexual Health Association (ASHA): www.ashastd.org  U.S. Department of Health and Human Services, Office on Women's Health: www.womenshealth.gov/ or https://www.womenshealth.gov/a-z-topics/bacterial-vaginosis Contact a health care provider if:  Your symptoms do not improve, even after treatment.  You have more discharge or pain when urinating.  You have a fever.  You have pain in your abdomen.  You have pain during sex.  You have vaginal bleeding between periods. Summary  Bacterial vaginosis is a vaginal infection that occurs when the normal balance of bacteria in the vagina is disrupted.  Because bacterial vaginosis increases your risk for STIs (sexually transmitted infections), getting treated can help reduce your risk for chlamydia, gonorrhea, herpes, and HIV (human immunodeficiency virus). Treatment is also important for preventing complications in pregnant women, because the condition can cause an early (premature) delivery.  This condition is treated with antibiotic medicines. These may be given as a pill, a vaginal cream, or a medicine that is put into the vagina (suppository). This information is not intended to replace advice given to you by your health care provider. Make sure you discuss any questions you have with your health care provider. Document Revised: 11/08/2017 Document Reviewed: 08/11/2016 Elsevier Patient Education  2020 Elsevier Inc.  

## 2020-06-10 NOTE — ED Triage Notes (Signed)
Pt arrives to ED w/ c/o vagina bleeding that started yesterday. Pt is [redacted] weeks pregnant. Pt denies abdominal pain.

## 2020-06-10 NOTE — MAU Provider Note (Addendum)
History     CSN: [id number]  Arrival date and time: 06/10/20 1634   First Provider Initiated Contact with Patient 06/10/20 1902      Chief Complaint  Patient presents with  . Vaginal Bleeding   Jocelyn Bennett is a 39 y.o. G4P3 at [redacted]w[redacted]d by LMP who presents to MAU with complaints of vaginal bleeding. She reports vaginal bleeding started occurring yesterday. Describes vaginal bleeding as pinkish red that is "more than spotting but less than a period". She reports being at work today and bleeding soaked through her underwear. She denies needing to wear a pad or panty liner right now. She denies abdominal pain, urinary symptoms or any other complaints.    OB History    Gravida  4   Para  3   Term  1   Preterm  2   AB  0   Living  3     SAB  0   TAB  0   Ectopic  0   Multiple  0   Live Births  3           History reviewed. No pertinent past medical history.  History reviewed. No pertinent surgical history.  History reviewed. No pertinent family history.  Social History   Tobacco Use  . Smoking status: Former Smoker    Types: Cigarettes    Quit date: 05/11/2020    Years since quitting: 0.0  . Smokeless tobacco: Never Used  Substance Use Topics  . Alcohol use: Yes  . Drug use: No    Allergies: No Known Allergies  Medications Prior to Admission  Medication Sig Dispense Refill Last Dose  . Prenatal Vit-Fe Fumarate-FA (PRENATAL MULTIVITAMIN) TABS tablet Take 1 tablet by mouth daily at 12 noon.   06/09/2020 at Unknown time    Review of Systems  Constitutional: Negative.   Respiratory: Negative.   Cardiovascular: Negative.   Gastrointestinal: Negative.   Genitourinary: Positive for vaginal bleeding. Negative for difficulty urinating, dysuria, frequency, hematuria, pelvic pain and urgency.  Musculoskeletal: Negative.   Neurological: Negative.   Psychiatric/Behavioral: Negative.    Physical Exam   Blood pressure 138/84, pulse 89, temperature 98.4 F (36.9  C), temperature source Oral, resp. rate 18, weight 72.5 kg, last menstrual period 04/12/2020, SpO2 100 %.  Physical Exam Vitals reviewed. Exam conducted with a chaperone present.  HENT:     Head: Normocephalic.  Cardiovascular:     Rate and Rhythm: Normal rate and regular rhythm.  Pulmonary:     Effort: Pulmonary effort is normal. No respiratory distress.     Breath sounds: Normal breath sounds. No wheezing.  Abdominal:     General: There is no distension.     Palpations: Abdomen is soft. There is no mass.     Tenderness: There is no abdominal tenderness. There is no guarding.  Genitourinary:    Exam position: Lithotomy position.     Vagina: Bleeding present.     Comments: Pelvic exam: Cervix pink, visually closed, without lesion, small amount of dark red mucousy vaginal bleeding present, no clots, vaginal walls and external genitalia normal Bimanual exam: Cervix 0/long/high, firm, anterior, neg CMT, uterus nontender, nonenlarged, adnexa without tenderness, enlargement, or mass Skin:    General: Skin is warm and dry.  Neurological:     Mental Status: She is alert and oriented to person, place, and time.  Psychiatric:        Mood and Affect: Mood normal.        Behavior: Behavior  normal.        Thought Content: Thought content normal.    MAU Course  Procedures Results for orders placed or performed during the hospital encounter of 06/10/20 (from the past 24 hour(s))  POC Urine Pregnancy, ED (not at [facility])     Status: Abnormal   Collection Time: 06/10/20  5:40 PM  Result Value Ref Range   Preg Test, Ur POSITIVE (A) NEGATIVE  CBC     Status: None   Collection Time: 06/10/20  6:49 PM  Result Value Ref Range   WBC 7.2 4.0 - 10.5 K/uL   RBC 4.31 3.87 - 5.11 MIL/uL   Hemoglobin 12.0 12.0 - 15.0 g/dL   HCT 54.6 36 - 46 %   MCV 84.5 80.0 - 100.0 fL   MCH 27.8 26.0 - 34.0 pg   MCHC 33.0 30.0 - 36.0 g/dL   RDW 56.8 12.7 - 51.7 %   Platelets 347 150 - 400 K/uL   nRBC 0.0 0.0 - 0.2  %  ABO/Rh     Status: None   Collection Time: 06/10/20  6:49 PM  Result Value Ref Range   ABO/RH(D)      B POS Performed at [facility], [address] [place], [place] [address]   hCG, quantitative, pregnancy     Status: Abnormal   Collection Time: 06/10/20  6:49 PM  Result Value Ref Range   hCG, Beta Chain, Quant, S 26,515 (H) <5 mIU/mL  Wet prep, genital     Status: Abnormal   Collection Time: 06/10/20  7:32 PM   Specimen: PATH Cytology Cervicovaginal Ancillary Only  Result Value Ref Range   Yeast Wet Prep HPF POC NONE SEEN NONE SEEN   Trich, Wet Prep NONE SEEN NONE SEEN   Clue Cells Wet Prep HPF POC PRESENT (A) NONE SEEN   WBC, Wet Prep HPF POC MANY (A) NONE SEEN   Sperm NONE SEEN    US OB LESS THAN 14 WEEKS WITH OB TRANSVAGINAL  Result Date: 06/10/2020 CLINICAL DATA:  Vaginal bleeding EXAM: OBSTETRIC <14 WK [place] AND TRANSVAGINAL OB US TECHNIQUE: Both transabdominal and transvaginal ultrasound examinations were performed for complete evaluation of the gestation as well as the maternal uterus, adnexal regions, and pelvic cul-de-sac. Transvaginal technique was performed to assess early pregnancy. COMPARISON:  None. FINDINGS: Intrauterine gestational sac: Single Yolk sac:  Visualized and appears to be enlarged. Embryo:  Not Visualized. Cardiac Activity: Not Visualized. Heart Rate: N/A  bpm CRL:  2.4 mm   5 w   5 d                  [place] EDC: February 05, 2021 Subchorionic hemorrhage:  None visualized. Maternal uterus/adnexae: The ovaries are visualized bilaterally and are normal in appearance. No free fluid is seen. IMPRESSION: Single intrauterine gestational sac, at approximately 5 weeks and 5 days gestation by ultrasound evaluation, without evidence of a fetal pole. While this may be secondary to early intrauterine pregnancy, correlation with follow-up pelvic ultrasound is recommended. Electronically Signed   By: Aram Candela M.D.   On: 06/10/2020 20:59    MDM Orders Placed This  Encounter  Procedures  . Wet prep, genital  . US OB LESS THAN 14 WEEKS WITH OB TRANSVAGINAL  . CBC  . hCG, quantitative, pregnancy  . POC Urine Pregnancy, ED (not at [facility])  . ABO/Rh   Labs and [place] pending @ 2008 Care taken over by Gerrit Heck, CNM @ 2008  Suzette Battiest  Eliezer Lofts, CNM 06/10/20, 8:09 PM   Assessment and Plan  Reassessment (9:12 PM) IUGS with [facility]S  Bacterial Vaginosis  -Results return as above. -Patient informed of findings and need for treatment and follow up. -Questions and concerns regarding lack of fetal pole reviewed. -Bleeding Precautions given. -Discussed BV and treatment. -Rx for metronidazole sent to pharmacy on file.  -Order placed for outpt [place] for viability; okay to have at [facility]. -Encouraged to call or return to MAU if symptoms worsen or with the onset of new symptoms. -Discharged to home in stable condition.  Cherre Robins MSN, CNM Advanced Practice Provider, [facility] [employer]

## 2020-06-14 LAB — GC/CHLAMYDIA PROBE AMP (~~LOC~~) NOT AT ARMC
Chlamydia: NEGATIVE
Comment: NEGATIVE
Comment: NORMAL
Neisseria Gonorrhea: NEGATIVE

## 2020-06-20 ENCOUNTER — Inpatient Hospital Stay (HOSPITAL_COMMUNITY): Payer: BC Managed Care – PPO

## 2020-06-20 ENCOUNTER — Inpatient Hospital Stay (HOSPITAL_COMMUNITY)
Admission: AD | Admit: 2020-06-20 | Discharge: 2020-06-20 | Disposition: A | Discharge status: Home / Self Care | Payer: BC Managed Care – PPO | Attending: Obstetrics & Gynecology | Admitting: Obstetrics & Gynecology

## 2020-06-20 ENCOUNTER — Other Ambulatory Visit: Payer: Self-pay

## 2020-06-20 ENCOUNTER — Encounter (HOSPITAL_COMMUNITY): Payer: Self-pay | Admitting: Obstetrics & Gynecology

## 2020-06-20 DIAGNOSIS — O209 Hemorrhage in early pregnancy, unspecified: Secondary | ICD-10-CM

## 2020-06-20 DIAGNOSIS — Z87891 Personal history of nicotine dependence: Secondary | ICD-10-CM | POA: Insufficient documentation

## 2020-06-20 DIAGNOSIS — O039 Complete or unspecified spontaneous abortion without complication: Secondary | ICD-10-CM | POA: Diagnosis not present

## 2020-06-20 DIAGNOSIS — M545 Low back pain, unspecified: Secondary | ICD-10-CM

## 2020-06-20 HISTORY — DX: Other specified health status: Z78.9

## 2020-06-20 LAB — HEMOGLOBIN AND HEMATOCRIT, BLOOD
HCT: 35.6 % — ABNORMAL LOW (ref 36.0–46.0)
Hemoglobin: 11.7 g/dL — ABNORMAL LOW (ref 12.0–15.0)

## 2020-06-20 LAB — HCG, QUANTITATIVE, PREGNANCY: hCG, Beta Chain, Quant, S: 2841 m[IU]/mL — ABNORMAL HIGH (ref <5)

## 2020-06-20 MED ORDER — CYCLOBENZAPRINE HCL 5 MG PO TABS
5.0000 mg | ORAL_TABLET | Freq: Once | ORAL | Status: AC
  Administered 2020-06-20: 5 mg via ORAL
  Filled 2020-06-20: qty 1

## 2020-06-20 MED ORDER — KETOROLAC TROMETHAMINE 30 MG/ML IJ SOLN
30.0000 mg | Freq: Once | INTRAMUSCULAR | Status: AC
  Administered 2020-06-20: 30 mg via INTRAMUSCULAR

## 2020-06-20 MED ORDER — KETOROLAC TROMETHAMINE 30 MG/ML IJ SOLN
30.0000 mg | Freq: Once | INTRAMUSCULAR | Status: DC
  Filled 2020-06-20: qty 1

## 2020-06-20 NOTE — MAU Note (Signed)
.   Jocelyn Bennett is a 39 y.o. at [redacted]w[redacted]d here in MAU reporting: that she started having abdominal and back pain on Friday. States she has a known miscarriage. Heavy vaginal bleeding.  Onset of complaint: ongoing  Pain score: ABD 7/10 Back 9/10 Vitals:   06/20/20 1009  BP: 117/77  Pulse: 74  Resp: 18  Temp: 98.4 F (36.9 C)  SpO2: 100%     FHT: Lab orders placed from triage:

## 2020-06-20 NOTE — MAU Provider Note (Signed)
Chief Complaint: Vaginal Bleeding, Back Pain, and Abdominal Pain   First Provider Initiated Contact with Patient 06/20/20 1044     SUBJECTIVE HPI: Jocelyn Bennett is a 39 y.o. [id number] at [redacted]w[redacted]d who presents to Maternity Admissions reporting vaginal bleeding, cramping, and back pain. The back pain is her main concern. She reports that her private OB confirmed a miscarriage - her hcg has dropped from 26,000 to 13,000. She had heavy bleeding with clots until Sunday, but that eased up and she has just moderate bleeding today.  Location: midline low back Quality: aching Severity: 8/10 on pain scale Duration: since yesterday Timing: constant, worse with movement Modifying factors: none, has not taken anything for pain Associated signs and symptoms: vaginal bleeding/cramping  Past Medical History:  Diagnosis Date  . Medical history non-contributory    OB History  Gravida Para Term Preterm AB Living  4 3 1 2  0 3  SAB TAB Ectopic Multiple Live Births  0 0 0 0 3    # Outcome Date GA Lbr Len/2nd Weight Sex Delivery Anes PTL Lv  4 Current           3 Preterm  [redacted]w[redacted]d    Vag-Spont     2 Preterm  [redacted]w[redacted]d    Vag-Spont     1 Term  [redacted]w[redacted]d    Vag-Spont      Past Surgical History:  Procedure Laterality Date  . NO PAST SURGERIES     Social History   Socioeconomic History  . Marital status: Married    Spouse name: Not on file  . Number of children: Not on file  . Years of education: Not on file  . Highest education level: Not on file  Occupational History  . Not on file  Tobacco Use  . Smoking status: Former Smoker    Types: Cigarettes    Quit date: 05/11/2020    Years since quitting: 0.1  . Smokeless tobacco: Never Used  Substance and Sexual Activity  . Alcohol use: Not Currently  . Drug use: No  . Sexual activity: Not Currently  Other Topics Concern  . Not on file  Social History Narrative  . Not on file   Social Determinants of Health   Financial Resource Strain:   . Difficulty of  Paying Living Expenses:   Food Insecurity:   . Worried About 07/11/2020 in the Last Year:   . Programme researcher, broadcasting/film/video in the Last Year:   Transportation Needs:   . Barista (Medical):   Freight forwarder Lack of Transportation (Non-Medical):   Physical Activity:   . Days of Exercise per Week:   . Minutes of Exercise per Session:   Stress:   . Feeling of Stress :   Social Connections:   . Frequency of Communication with Friends and Family:   . Frequency of Social Gatherings with Friends and Family:   . Attends Religious Services:   . Active Member of Clubs or Organizations:   . Attends [employer] Meetings:   Banker Marital Status:   Intimate Partner Violence:   . Fear of Current or Ex-Partner:   . Emotionally Abused:   [employer] Physically Abused:   . Sexually Abused:    History reviewed. No pertinent family history. No current facility-administered medications on file prior to encounter.   Current Outpatient Medications on File Prior to Encounter  Medication Sig Dispense Refill  . metroNIDAZOLE (FLAGYL) 500 MG tablet Take 1 tablet (500 mg total) by mouth 2 (  two) times daily. 14 tablet 0  . Prenatal Vit-Fe Fumarate-FA (PRENATAL MULTIVITAMIN) TABS tablet Take 1 tablet by mouth daily at 12 noon.     No Known Allergies  I have reviewed patient's Past Medical Hx, Surgical Hx, Family Hx, Social Hx, medications and allergies.   Review of Systems  Constitutional: Negative.   HENT: Negative.   Eyes: Negative for photophobia and visual disturbance.  Respiratory: Negative for cough and shortness of breath.   Cardiovascular: Negative for palpitations.  Gastrointestinal: Positive for nausea (still pregnancy nausea) and vomiting (once this morning). Negative for abdominal pain.  Endocrine: Negative.   Genitourinary: Positive for pelvic pain and vaginal bleeding (decreasing). Negative for vaginal discharge and vaginal pain.  Musculoskeletal: Positive for back pain (low back).  Skin:  Negative.   Allergic/Immunologic: Negative.   Neurological: Negative for dizziness, syncope, light-headedness and headaches.  Hematological: Negative.   Psychiatric/Behavioral: Negative.     OBJECTIVE Patient Vitals for the past 24 hrs:  BP Temp Pulse Resp SpO2 Height Weight  06/20/20 1342 122/63 -- -- -- -- -- --  06/20/20 1009 117/77 98.4 F (36.9 C) 74 18 100 % 5\' 4"  (1.626 m) 156 lb (70.8 kg)   Constitutional: Well-developed, well-nourished female in no acute distress.  Cardiovascular: normal rate & rhythm, no murmur Respiratory: normal rate and effort. Lung sounds clear throughout GI: Abd soft, non-tender, Pos BS x 4. No guarding or rebound tenderness MS: Extremities nontender, no edema, normal ROM Neurologic: Alert and oriented x 4.   SPECULUM EXAM: deferred  LAB RESULTS Results for orders placed or performed during the hospital encounter of 06/20/20 (from the past 24 hour(s))  Hemoglobin and hematocrit, blood     Status: Abnormal   Collection Time: 06/20/20 11:25 AM  Result Value Ref Range   Hemoglobin 11.7 (L) 12.0 - 15.0 g/dL   HCT 08/21/20 (L) 36 - 46 %  hCG, quantitative, pregnancy     Status: Abnormal   Collection Time: 06/20/20 11:25 AM  Result Value Ref Range   hCG, Beta Chain, Quant, S 2,841 (H) <5 mIU/mL    IMAGING 12[birth year]24[birth year]1984 OB Transvaginal  Result Date: 06/20/2020 CLINICAL DATA:  Abdominal and back pain, vaginal bleeding, early pregnancy of uncertain viability on prior examination EXAM: OBSTETRIC <14 WK 08/21/2020 AND TRANSVAGINAL OB US TECHNIQUE: Both transabdominal and transvaginal ultrasound examinations were performed for complete evaluation of the gestation as well as the maternal uterus, adnexal regions, and pelvic cul-de-sac. Transvaginal technique was performed to assess early pregnancy. COMPARISON:  06/10/2020 FINDINGS: Intrauterine gestational sac: None Yolk sac:  Not Visualized. Embryo:  Not Visualized. Cardiac Activity: Not Visualized. Maternal uterus/adnexae: Normal.  IMPRESSION: Previously noted intrauterine gestational sac is nonvisualized, consistent completed spontaneous abortion of early pregnancy. Electronically Signed   By: 08/11/2020 M.D.   On: 06/20/2020 12:07    MAU COURSE Orders Placed This Encounter  Procedures  . 08/21/2020 OB Transvaginal  . Urinalysis, Routine w reflex microscopic  . Hemoglobin and hematocrit, blood  . hCG, quantitative, pregnancy  . Discharge patient   Meds ordered this encounter  Medications  . DISCONTD: ketorolac (TORADOL) 30 MG/ML injection 30 mg  . ketorolac (TORADOL) 30 MG/ML injection 30 mg  . cyclobenzaprine (FLEXERIL) tablet 5 mg    MDM Hcg is decreasing appropriately No POC seen on ultrasound Pain moderately relieved after toradol and flexeril Discussed ways to relieve back pain including use of ibuprofen, heating pad, stretching, wearing a back brace at work, complementary therapies such as chiropractic care.  ASSESSMENT 1. Miscarriage   2. Vaginal bleeding in pregnancy, first trimester   3. Midline low back pain without sciatica, unspecified chronicity     PLAN Discharge home in stable condition.    Follow-up Information    Ob/[facility]yn, [place]. Go to.   Specialty: Obstetrics and Gynecology Why: as scheduled for miscarriage follow up Contact information: [address] [place] [address] [phone]              Allergies as of 06/20/2020   No Known Allergies     Medication List    TAKE these medications   metroNIDAZOLE 500 MG tablet Commonly known as: Flagyl Take 1 tablet (500 mg total) by mouth 2 (two) times daily.   prenatal multivitamin Tabs tablet Take 1 tablet by mouth daily at 12 noon.       Bernerd Limbo, [place] 06/20/2020  1:47 PM

## 2020-06-20 NOTE — Discharge Instructions (Signed)
Acute Back Pain, Adult Acute back pain is sudden and usually short-lived. It is often caused by an injury to the muscles and tissues in the back. The injury may result from:  A muscle or ligament getting overstretched or torn (strained). Ligaments are tissues that connect bones to each other. Lifting something improperly can cause a back strain.  Wear and tear (degeneration) of the spinal disks. Spinal disks are circular tissue that provides cushioning between the bones of the spine (vertebrae).  Twisting motions, such as while playing sports or doing yard work.  A hit to the back.  Arthritis. You may have a physical exam, lab tests, and imaging tests to find the cause of your pain. Acute back pain usually goes away with rest and home care. Follow these instructions at home: Managing pain, stiffness, and swelling  Take over-the-counter and prescription medicines only as told by your health care provider.  Your health care provider may recommend applying ice during the first 24-48 hours after your pain starts. To do this: ? Put ice in a plastic bag. ? Place a towel between your skin and the bag. ? Leave the ice on for 20 minutes, 2-3 times a day.  If directed, apply heat to the affected area as often as told by your health care provider. Use the heat source that your health care provider recommends, such as a moist heat pack or a heating pad. ? Place a towel between your skin and the heat source. ? Leave the heat on for 20-30 minutes. ? Remove the heat if your skin turns bright red. This is especially important if you are unable to feel pain, heat, or cold. You have a greater risk of getting burned. Activity   Do not stay in bed. Staying in bed for more than 1-2 days can delay your recovery.  Sit up and stand up straight. Avoid leaning forward when you sit, or hunching over when you stand. ? If you work at a desk, sit close to it so you do not need to lean over. Keep your chin tucked  in. Keep your neck drawn back, and keep your elbows bent at a right angle. Your arms should look like the letter "L." ? Sit high and close to the steering wheel when you drive. Add lower back (lumbar) support to your car seat, if needed.  Take short walks on even surfaces as soon as you are able. Try to increase the length of time you walk each day.  Do not sit, drive, or stand in one place for more than 30 minutes at a time. Sitting or standing for long periods of time can put stress on your back.  Do not drive or use heavy machinery while taking prescription pain medicine.  Use proper lifting techniques. When you bend and lift, use positions that put less stress on your back: ? Bend your knees. ? Keep the load close to your body. ? Avoid twisting.  Exercise regularly as told by your health care provider. Exercising helps your back heal faster and helps prevent back injuries by keeping muscles strong and flexible.  Work with a physical therapist to make a safe exercise program, as recommended by your health care provider. Do any exercises as told by your physical therapist. Lifestyle  Maintain a healthy weight. Extra weight puts stress on your back and makes it difficult to have good posture.  Avoid activities or situations that make you feel anxious or stressed. Stress and anxiety increase muscle   tension and can make back pain worse. Learn ways to manage anxiety and stress, such as through exercise. General instructions  Sleep on a firm mattress in a comfortable position. Try lying on your side with your knees slightly bent. If you lie on your back, put a pillow under your knees.  Follow your treatment plan as told by your health care provider. This may include: ? Cognitive or behavioral therapy. ? Acupuncture or massage therapy. ? Meditation or yoga. Contact a health care provider if:  You have pain that is not relieved with rest or medicine.  You have increasing pain going down  into your legs or buttocks.  Your pain does not improve after 2 weeks.  You have pain at night.  You lose weight without trying.  You have a fever or chills. Get help right away if:  You develop new bowel or bladder control problems.  You have unusual weakness or numbness in your arms or legs.  You develop nausea or vomiting.  You develop abdominal pain.  You feel faint. Summary  Acute back pain is sudden and usually short-lived.  Use proper lifting techniques. When you bend and lift, use positions that put less stress on your back.  Take over-the-counter and prescription medicines and apply heat or ice as directed by your health care provider. This information is not intended to replace advice given to you by your health care provider. Make sure you discuss any questions you have with your health care provider. Document Revised: 03/17/2019 Document Reviewed: 07/10/2017 Elsevier Patient Education  2020 [employer]    Managing Pregnancy Loss Pregnancy loss can happen any time during a pregnancy. Often the cause is not known. It is rarely because of anything you did. Pregnancy loss in early pregnancy (during the first trimester) is called a miscarriage. This type of pregnancy loss is the most common. Pregnancy loss that happens after 20 weeks of pregnancy is called fetal demise if the baby's heart stops beating before birth. Fetal demise is much less common. Some women experience spontaneous labor shortly after fetal demise resulting in a stillborn birth (stillbirth). Any pregnancy loss can be devastating. You will need to recover both physically and emotionally. Most women are able to get pregnant again after a pregnancy loss and deliver a healthy baby. How to manage emotional recovery  Pregnancy loss is very hard emotionally. You may feel many different emotions while you grieve. You may feel sad and angry. You may also feel guilty. It is normal to have periods of crying.  Emotional recovery can take longer than physical recovery. It is different for everyone. Taking these steps can help you in managing this loss:  Remember that it is unlikely you did anything to cause the pregnancy loss.  Share your thoughts and feelings with friends, family, and your partner. Remember that your partner is also recovering emotionally.  Make sure you have a good support system. Do not spend too much time alone.  Meet with a pregnancy loss counselor or join a pregnancy loss support group.  Get enough sleep and eat a healthy diet. Return to regular exercise when you have recovered physically.  Do not use drugs or alcohol to manage your emotions.  Consider seeing a mental health professional to help you recover emotionally.  Ask a friend or loved one to help you decide what to do with any clothing and nursery items you received for your baby. In the case of a stillbirth, many women benefit from taking  additional steps in the grieving process. You may want to:  Hold your baby after the birth.  Name your baby.  Request a birth certificate.  Create a keepsake such as handprints or footprints.  Dress your baby and have a picture taken.  Make funeral arrangements.  Ask for a baptism or blessing. Hospitals have staff members who can help you with all these arrangements. How to recognize emotional stress It is normal to have emotional stress after a pregnancy loss. But emotional stress that lasts a long time or becomes severe requires treatment. Watch out for these signs of severe emotional stress:  Sadness, anger, or guilt that is not going away and is interfering with your normal activities.  Relationship problems that have occurred or gotten worse since the pregnancy loss.  Signs of depression that last longer than 2 weeks. These may include: ? Sadness. ? Anxiety. ? Hopelessness. ? Loss of interest in activities you enjoy. ? Inability to concentrate. ? Trouble  sleeping or sleeping too much. ? Loss of appetite or overeating. ? Thoughts of death or of hurting yourself. Follow these instructions at home:  Take over-the-counter and prescription medicines only as told by your health care provider.  Rest at home until your energy level returns. Return to your normal activities as told by your health care provider. Ask your health care provider what activities are safe for you.  When you are ready, meet with your health care provider to discuss steps to take for a future pregnancy.  Keep all follow-up visits as told by your health care provider. This is important. Where to find support  To help you and your partner with the process of grieving, talk with your health care provider or seek counseling.  Consider meeting with others who have experienced pregnancy loss. Ask your health care provider about support groups and resources. Where to find more information  [place] Department of Health and [employer] on Women's Health: http://hoffman.com/  American Pregnancy Association: www.americanpregnancy.org Contact a health care provider if:  You continue to experience grief, sadness, or lack of motivation for everyday activities, and those feelings do not improve over time.  You are struggling to recover emotionally, especially if you are using alcohol or substances to help. Get help right away if:  You have thoughts of hurting yourself or others. If you ever feel like you may hurt yourself or others, or have thoughts about taking your own life, get help right away. You can go to your nearest emergency department or call:  Your local emergency services (911 in the [place]  A suicide crisis helpline, such as the National Suicide Prevention Lifeline at 6696929907. This is open 24 hours a day. Summary  Any pregnancy loss can be difficult physically and emotionally.  You may experience many different emotions while you grieve. Emotional  recovery can last longer than physical recovery.  It is normal to have emotional stress after a pregnancy loss. But emotional stress that lasts a long time or becomes severe requires treatment.  See your health care provider if you are struggling emotionally after a pregnancy loss. This information is not intended to replace advice given to you by your health care provider. Make sure you discuss any questions you have with your health care provider. Document Revised: 03/18/2019 Document Reviewed: 02/06/2018 Elsevier Patient Education  2020 [employer].

## 2020-06-23 ENCOUNTER — Ambulatory Visit: Admission: RE | Admit: 2020-06-23 | Payer: BC Managed Care – PPO | Source: Ambulatory Visit

## 2020-10-21 IMAGING — US US OB < 14 WEEKS - US OB TV
1 series · 15 of 28 positions shown · non-contrast
Comparison: None.

CLINICAL DATA: Vaginal bleeding

EXAM:
OBSTETRIC <14 WK US AND TRANSVAGINAL OB US
TECHNIQUE: Both transabdominal and transvaginal ultrasound examinations were
performed for complete evaluation of the gestation as well as the
maternal uterus, adnexal regions, and pelvic cul-de-sac.
Transvaginal technique was performed to assess early pregnancy.

[Series 1: us ob < 14 weeks - us ob tv · 66 acquisitions, 15 frames shown]
[im 1/66]
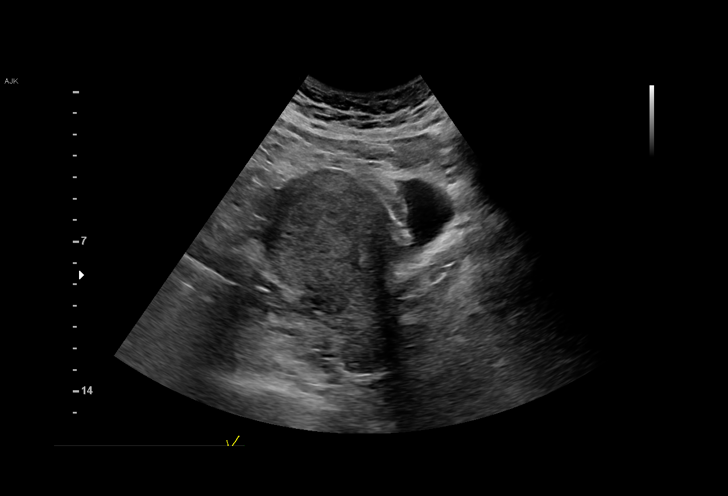
[im 5/66]
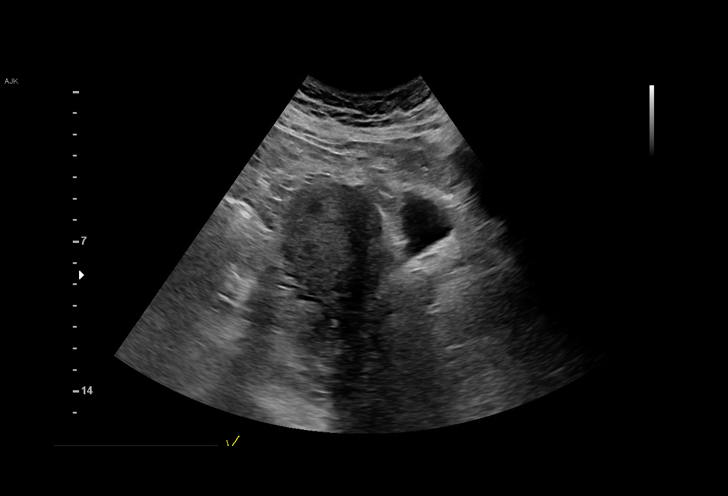
[im 10/66]
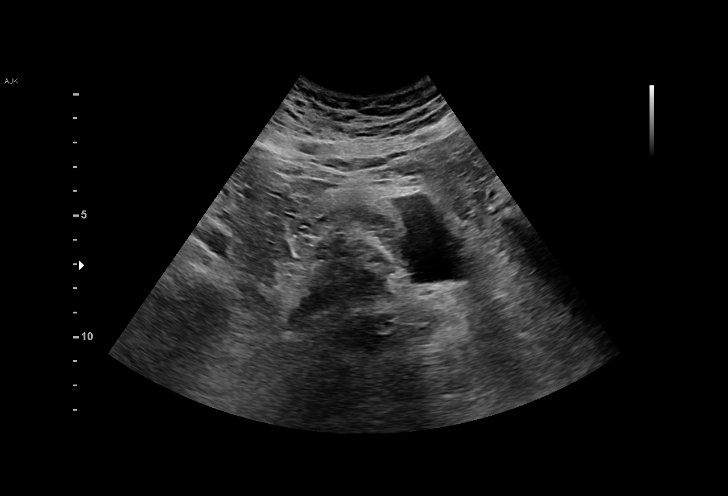
[im 15/66]
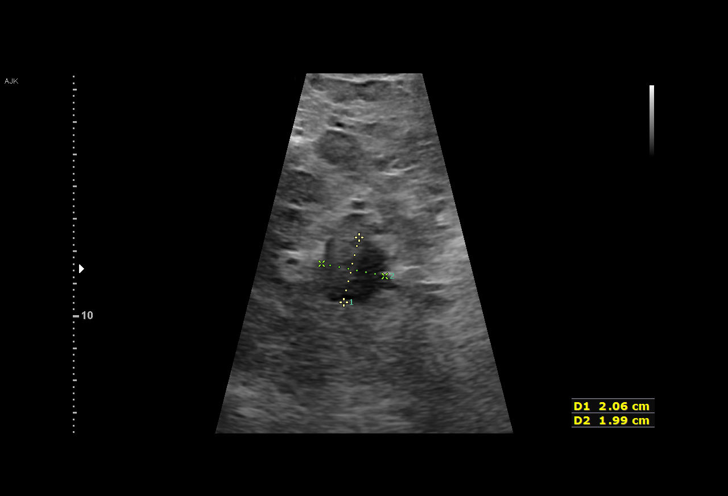
[im 20/66]
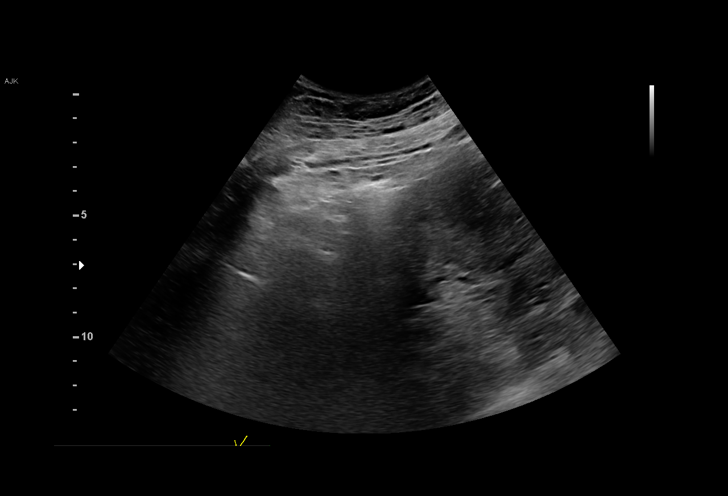
[im 25/66]
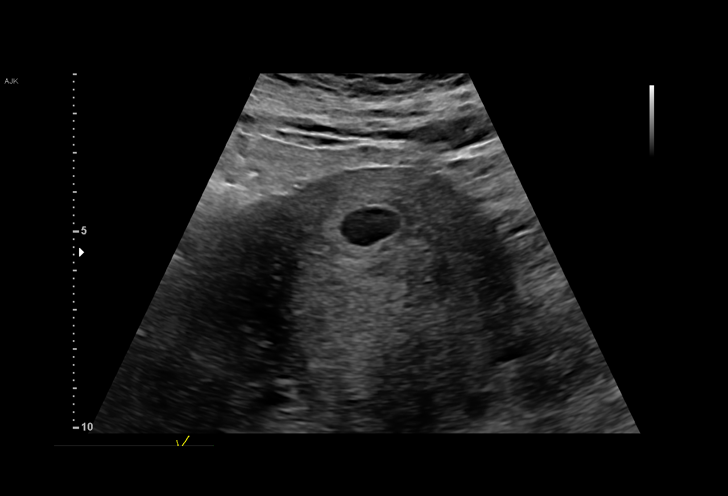
[im 29/66]
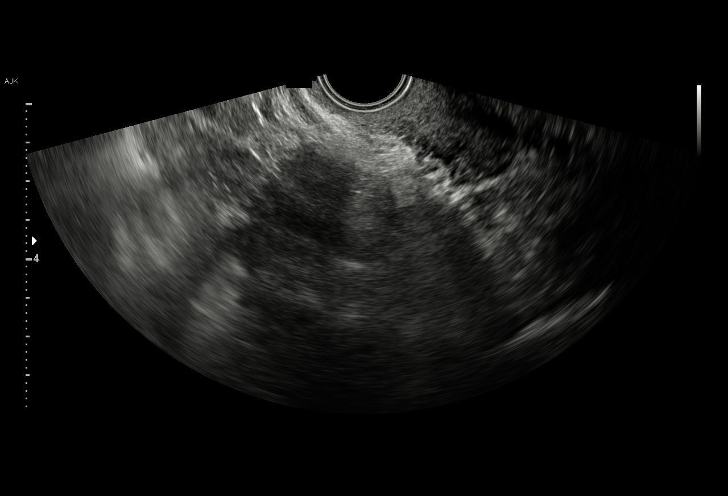
[im 34/66]
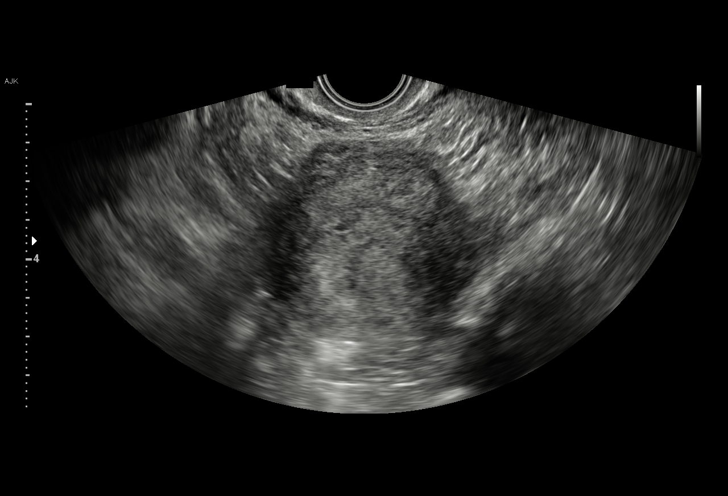
[im 37/66]
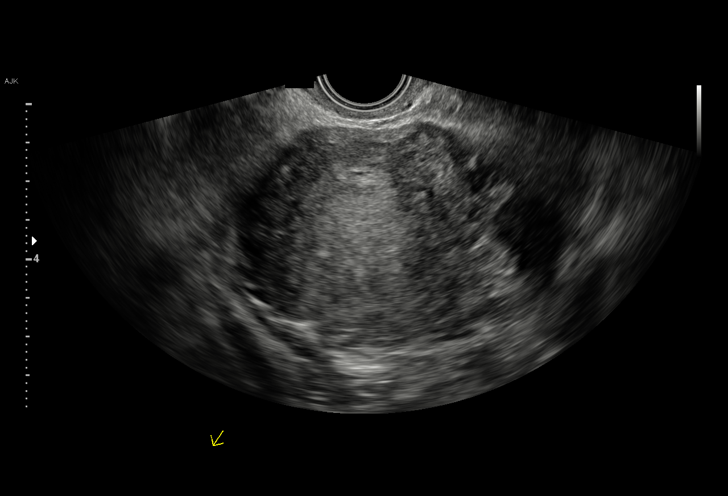
[im 41/66]
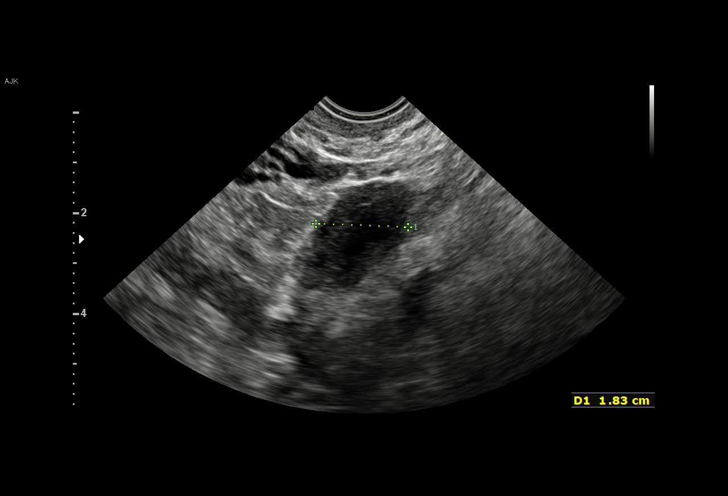
[im 46/66]
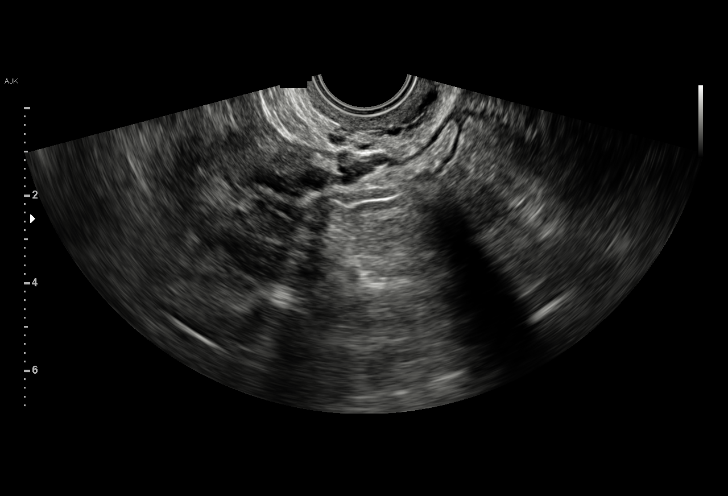
[im 51/66]
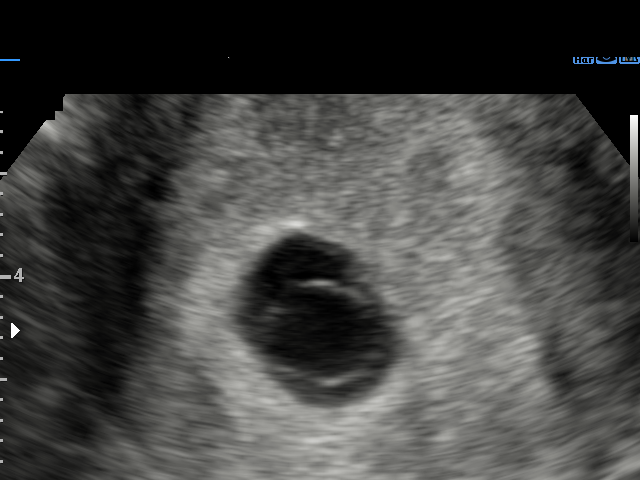
[im 56/66]
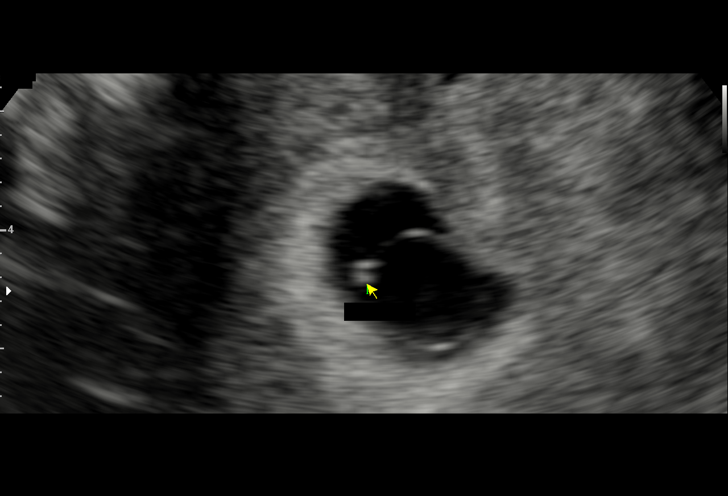
[im 61/66]
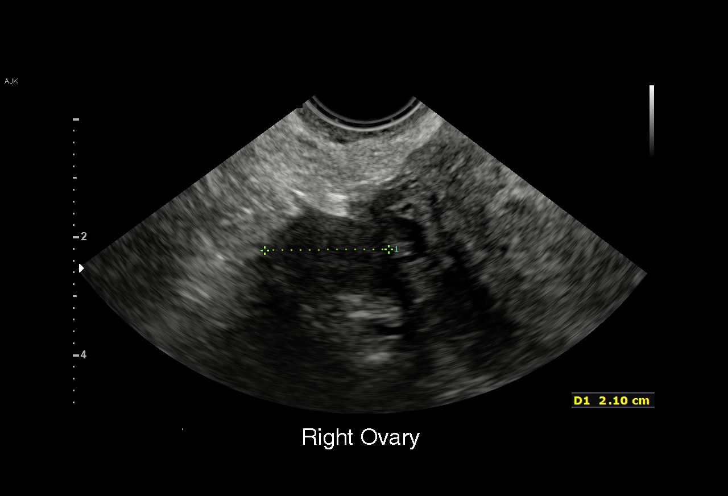
[im 66/66]
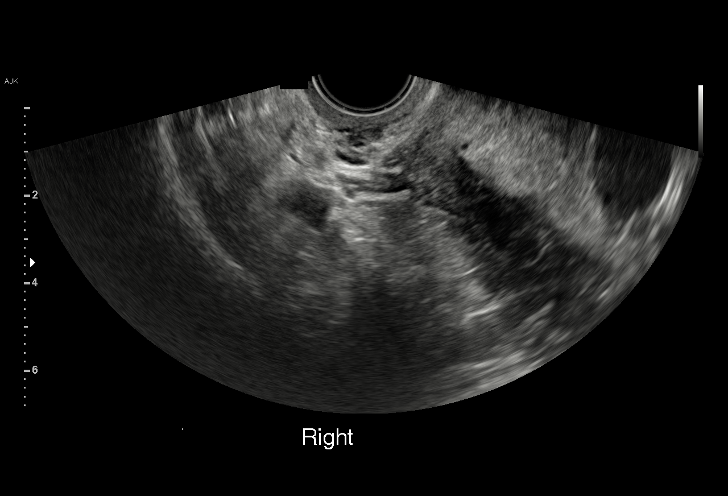

[15 of 28 positions shown; findings below may reference images not displayed]

FINDINGS: Intrauterine gestational sac: Single

Yolk sac:  Visualized and appears to be enlarged.

Embryo:  Not Visualized.

Cardiac Activity: Not Visualized.

Heart Rate: N/A  bpm

CRL:  2.4 mm   5 w   5 d                  US EDC: February 05, 2021

Subchorionic hemorrhage:  None visualized.

Maternal uterus/adnexae: The ovaries are visualized bilaterally and
are normal in appearance.

No free fluid is seen.
IMPRESSION: Single intrauterine gestational sac, at approximately 5 weeks and 5
days gestation by ultrasound evaluation, without evidence of a fetal
pole. While this may be secondary to early intrauterine pregnancy,
correlation with follow-up pelvic ultrasound is recommended.

## 2020-12-12 ENCOUNTER — Ambulatory Visit
Admission: EM | Admit: 2020-12-12 | Discharge: 2020-12-12 | Disposition: A | Discharge status: Home / Self Care | Payer: BC Managed Care – PPO | Attending: Emergency Medicine | Admitting: Emergency Medicine

## 2020-12-12 DIAGNOSIS — Z20822 Contact with and (suspected) exposure to covid-19: Secondary | ICD-10-CM

## 2020-12-12 NOTE — ED Triage Notes (Signed)
Requesting covid testing, denies sx.

## 2020-12-12 NOTE — Discharge Instructions (Signed)
Please continue to keep social distance of 6 feet from others, wash hands frequently and wear facemask indoors or outdoors if you are unable to social distance.  If your Covid test is positive, member of the urgent care team will reach out to you with further instructions. If you have any further questions, please don't hesitate to reach out to the urgent care clinic.  Please sign up for MyChart to access your lab results.   If you know you were exposed to a confirmed case of COVID-19, continue quarantine until your test results are available.  Interact as little as possible until COVID-19 test results are available.  Continue to keep her social distance of 6 feet from others, wash hands frequently and wear facemask when indoors or when you are unable to social distance in outdoor settings.  If you develop any symptoms, reach out to the urgent care for further instructions.  If your test results are positive, a member of the urgent care team will reach out to you with further instructions. If you have any further questions, please don't hesitate to reach out to the urgent care clinic.  Please sign up for MyChart to access your lab results. 

## 2020-12-13 LAB — NOVEL CORONAVIRUS, NAA: SARS-CoV-2, NAA: NOT DETECTED

## 2020-12-13 LAB — SARS-COV-2, NAA 2 DAY TAT

## 2021-01-20 ENCOUNTER — Other Ambulatory Visit: Payer: BC Managed Care – PPO

## 2021-01-20 DIAGNOSIS — Z20822 Contact with and (suspected) exposure to covid-19: Secondary | ICD-10-CM

## 2021-01-21 LAB — NOVEL CORONAVIRUS, NAA: SARS-CoV-2, NAA: NOT DETECTED

## 2021-01-21 LAB — SARS-COV-2, NAA 2 DAY TAT

## 2022-01-10 DIAGNOSIS — O039 Complete or unspecified spontaneous abortion without complication: Secondary | ICD-10-CM

## 2023-02-25 DIAGNOSIS — F17200 Nicotine dependence, unspecified, uncomplicated: Secondary | ICD-10-CM | POA: Insufficient documentation

## 2023-04-20 DIAGNOSIS — A539 Syphilis, unspecified: Secondary | ICD-10-CM | POA: Insufficient documentation

## 2023-04-20 DIAGNOSIS — E785 Hyperlipidemia, unspecified: Secondary | ICD-10-CM | POA: Insufficient documentation

## 2023-08-05 ENCOUNTER — Ambulatory Visit
Admission: EM | Admit: 2023-08-05 | Discharge: 2023-08-05 | Disposition: A | Discharge status: Home / Self Care | Payer: BLUE CROSS/BLUE SHIELD | Attending: Family Medicine | Admitting: Family Medicine

## 2023-08-05 DIAGNOSIS — J069 Acute upper respiratory infection, unspecified: Secondary | ICD-10-CM

## 2023-08-05 DIAGNOSIS — N760 Acute vaginitis: Secondary | ICD-10-CM | POA: Insufficient documentation

## 2023-08-05 DIAGNOSIS — N898 Other specified noninflammatory disorders of vagina: Secondary | ICD-10-CM | POA: Insufficient documentation

## 2023-08-05 DIAGNOSIS — O039 Complete or unspecified spontaneous abortion without complication: Secondary | ICD-10-CM

## 2023-08-05 DIAGNOSIS — N946 Dysmenorrhea, unspecified: Secondary | ICD-10-CM | POA: Insufficient documentation

## 2023-08-05 DIAGNOSIS — E663 Overweight: Secondary | ICD-10-CM | POA: Insufficient documentation

## 2023-08-05 MED ORDER — BENZONATATE 200 MG PO CAPS: 200.0000 mg | ORAL_CAPSULE | Freq: Two times a day (BID) | ORAL | 0 refills | Status: AC | PRN

## 2023-08-05 MED ORDER — HYDROCOD POLI-CHLORPHE POLI ER 10-8 MG/5ML PO SUER: 5.0000 mL | Freq: Every evening | ORAL | 0 refills | Status: AC | PRN

## 2023-08-05 NOTE — ED Provider Notes (Signed)
[facility]    CSN: [id number] Arrival date & time: 08/05/23  1304      History   Chief Complaint Chief Complaint  Patient presents with   Cough    HPI Jocelyn Bennett is a 42 y.o. female.    Cough Associated symptoms: sore throat   Associated symptoms: no shortness of breath and no wheezing    Patient is here for a cough, sore throat, lost her voice.  Symptoms started about 1 week ago.  Cough is nonstop, all day, all night. She has taken otc medications without help.  No fevers.  No runny nose, congestion, drainage.  She did 3 covid tests, all negative.  No n/v.  No sob or wheezing.  No known exposures.       Past Medical History:  Diagnosis Date   Medical history non-contributory    Miscarriage 01/2022   Miscarriage 2020    Patient Active Problem List   Diagnosis Date Noted   Vaginal discharge 08/05/2023   Dysmenorrhea 08/05/2023   Bacterial vaginosis 08/05/2023   Overweight with body mass index (BMI) 25.0-29.9 08/05/2023   Syphilis 04/20/2023   Hyperlipidemia 04/20/2023   Smoker 02/25/2023   Miscarriage 01/2022    Past Surgical History:  Procedure Laterality Date   NO PAST SURGERIES      OB History     Gravida  4   Para  3   Term  1   Preterm  2   AB  0   Living  3      SAB  0   IAB  0   Ectopic  0   Multiple  0   Live Births  3            Home Medications    Prior to Admission medications   Medication Sig Start Date End Date Taking? Authorizing Provider  Dextromethorphan-guaiFENesin (MUCINEX DM MAXIMUM STRENGTH) 60-1200 MG TB12 Take by mouth. 04/23/16  Yes [provider]  doxycycline (MONODOX) 100 MG capsule Take 100 mg by mouth 2 (two) times daily. 05/09/23  Yes [provider]  Multiple Vitamin (MULTIVITAMIN) capsule Take 1 capsule by mouth daily.   Yes [provider]  valACYclovir (VALTREX) 500 MG tablet Take 500 mg by mouth 2 (two) times daily. 04/08/23  Yes [provider]  metroNIDAZOLE (FLAGYL) 500 MG tablet Take 1 tablet (500 mg total) by mouth 2 (two) times daily. 06/10/20   Gerrit Heck, CNM  metroNIDAZOLE (FLAGYL) 500 MG tablet Flagyl 500 mg tablet  Take 1 tablet every 12 hours by oral route for 7 days.    [provider]  naproxen (NAPROSYN) 500 MG tablet naproxen 500 mg tablet  Take 1 tablet twice a day by oral route as needed.    [provider]  Prenatal Vit-Fe Fumarate-FA (PRENATAL MULTIVITAMIN) TABS tablet Take 1 tablet by mouth daily at 12 noon.    [provider]    Family History History reviewed. No pertinent family history.  Social History Social History   Tobacco Use   Smoking status: Some Days    Current packs/day: 0.00    Types: Cigarettes    Last attempt to quit: 05/11/2020    Years since quitting: 3.2   Smokeless tobacco: Never  Vaping Use   Vaping status: Never Used  Substance Use Topics   Alcohol use: Not Currently   Drug use: No     Allergies   Patient has no known allergies.   Review of  Systems Review of Systems  Constitutional: Negative.   HENT:  Positive for sore throat.   Respiratory:  Positive for cough. Negative for shortness of breath and wheezing.   Gastrointestinal: Negative.   Genitourinary: Negative.   Musculoskeletal: Negative.   Psychiatric/Behavioral: Negative.       Physical Exam Triage Vital Signs ED Triage Vitals  Encounter Vitals Group     BP 08/05/23 1316 125/85     Systolic BP Percentile --      Diastolic BP Percentile --      Pulse Rate 08/05/23 1316 (!) 104     Resp 08/05/23 1316 18     Temp 08/05/23 1316 98.7 F (37.1 C)     Temp Source 08/05/23 1316 Oral     SpO2 08/05/23 1316 97 %     Weight 08/05/23 1312 160 lb (72.6 kg)     Height 08/05/23 1312 5\' 5"  (1.651 m)     Head Circumference --      Peak Flow --      Pain Score 08/05/23 1306 0     Pain Loc --      Pain Education --      Exclude from Growth Chart --    No data  found.  Updated Vital Signs BP 125/85 (BP Location: Left Arm)   Pulse 96   Temp 98.7 F (37.1 C) (Oral)   Resp 18   Ht 5\' 5"  (1.651 m)   Wt 72.6 kg   LMP 07/17/2023 (Exact Date)   SpO2 97%   BMI 26.63 kg/m   Visual Acuity Right Eye Distance:   Left Eye Distance:   Bilateral Distance:    Right Eye Near:   Left Eye Near:    Bilateral Near:     Physical Exam Constitutional:      Appearance: Normal appearance. She is normal weight.  HENT:     Nose: Congestion present. No rhinorrhea.     Mouth/Throat:     Mouth: Mucous membranes are moist.     Pharynx: No oropharyngeal exudate or posterior oropharyngeal erythema.  Cardiovascular:     Rate and Rhythm: Normal rate and regular rhythm.  Pulmonary:     Effort: Pulmonary effort is normal.     Breath sounds: Normal breath sounds.  Musculoskeletal:     Cervical back: Normal range of motion and neck supple. No tenderness.  Lymphadenopathy:     Cervical: No cervical adenopathy.  Skin:    General: Skin is warm.  Neurological:     General: No focal deficit present.     Mental Status: She is alert.  Psychiatric:        Mood and Affect: Mood normal.      UC Treatments / Results  Labs (all labs ordered are listed, but only abnormal results are displayed) Labs Reviewed - No data to display  EKG   Radiology No results found.  Procedures Procedures (including critical care time)  Medications Ordered in UC Medications - No data to display  Initial Impression / Assessment and Plan / UC Course  I have reviewed the triage vital signs and the nursing notes.  Pertinent labs & imaging results that were available during my care of the patient were reviewed by me and considered in my medical decision making (see chart for details).    Final Clinical Impressions(s) / UC Diagnoses   Final diagnoses:  Viral upper respiratory infection     Discharge Instructions      You were seen today  for upper respiratory symptoms.   Your exam is most consistent with a viral infection.  I have sent out medications to help with your cough.  You may take tylenol or motrin for sore throat and use salt water gargles.  If you have worsening cough, or develop fever or shortness of breath then please return for re-evaluation.     ED Prescriptions     Medication Sig Dispense Auth. Provider   chlorpheniramine-HYDROcodone (TUSSIONEX) 10-8 MG/5ML Take 5 mLs by mouth at bedtime as needed for up to 7 days for cough. 30 mL Mohmed Farver, MD   benzonatate (TESSALON) 200 MG capsule Take 1 capsule (200 mg total) by mouth 2 (two) times daily as needed for cough. 14 capsule Jannifer Franklin, MD      PDMP not reviewed this encounter.   Jannifer Franklin, MD 08/05/23 1332

## 2023-08-05 NOTE — Discharge Instructions (Signed)
You were seen today for upper respiratory symptoms.  Your exam is most consistent with a viral infection.  I have sent out medications to help with your cough.  You may take tylenol or motrin for sore throat and use salt water gargles.  If you have worsening cough, or develop fever or shortness of breath then please return for re-evaluation.

## 2023-08-05 NOTE — ED Triage Notes (Signed)
"  I have a cough for a week now". "It is not going away but now I have a sore throat and lost my voice recently for 2 days". No fever.

## 2023-11-10 ENCOUNTER — Ambulatory Visit
Admission: EM | Admit: 2023-11-10 | Discharge: 2023-11-10 | Discharge status: Against Medical Advice | Payer: BLUE CROSS/BLUE SHIELD

## 2023-11-11 ENCOUNTER — Encounter: Payer: Self-pay | Admitting: Emergency Medicine

## 2023-11-11 ENCOUNTER — Ambulatory Visit
Admission: EM | Admit: 2023-11-11 | Discharge: 2023-11-11 | Disposition: A | Discharge status: Home / Self Care | Payer: BLUE CROSS/BLUE SHIELD | Attending: Emergency Medicine | Admitting: Emergency Medicine

## 2023-11-11 DIAGNOSIS — H00015 Hordeolum externum left lower eyelid: Secondary | ICD-10-CM | POA: Diagnosis not present

## 2023-11-11 MED ORDER — ERYTHROMYCIN 5 MG/GM OP OINT: TOPICAL_OINTMENT | OPHTHALMIC | 0 refills | Status: AC

## 2023-11-11 NOTE — ED Provider Notes (Signed)
[facility]    CSN: [id number] Arrival date & time: 11/11/23  8657      History   Chief Complaint Chief Complaint  Patient presents with   Eye Problem    HPI Jocelyn Bennett is a 42 y.o. female.   42 year old female pt, Jocelyn Bennett, presents to urgent care for evaluation of left lower eyelid pain x 3 weeks.  Patient noticed the lump developed 1 week ago reports of burning and irritating sensation mild swelling and watering denies visual changes no drainage.  Prior history of same  The history is provided by the patient. No language interpreter was used.    Past Medical History:  Diagnosis Date   Medical history non-contributory    Miscarriage 01/2022   Miscarriage 2020    Patient Active Problem List   Diagnosis Date Noted   Hordeolum externum of left lower eyelid 11/11/2023   Vaginal discharge 08/05/2023   Dysmenorrhea 08/05/2023   Bacterial vaginosis 08/05/2023   Overweight with body mass index (BMI) 25.0-29.9 08/05/2023   Syphilis 04/20/2023   Hyperlipidemia 04/20/2023   Smoker 02/25/2023   Miscarriage 01/2022    Past Surgical History:  Procedure Laterality Date   NO PAST SURGERIES      OB History     Gravida  4   Para  3   Term  1   Preterm  2   AB  0   Living  3      SAB  0   IAB  0   Ectopic  0   Multiple  0   Live Births  3            Home Medications    Prior to Admission medications   Medication Sig Start Date End Date Taking? Authorizing Provider  erythromycin ophthalmic ointment Place a 1/2 inch ribbon of ointment into the left lower eyelid every 6 hours x 5 days 11/11/23  Yes Sullivan Blasing, Para March, NP  Multiple Vitamin (MULTIVITAMIN) capsule Take 1 capsule by mouth daily.   Yes [provider]  benzonatate (TESSALON) 200 MG capsule Take 1 capsule (200 mg total) by mouth 2 (two) times daily as needed for cough. 08/05/23   Piontek, Denny Peon, MD  naproxen (NAPROSYN) 500 MG tablet naproxen 500 mg tablet  Take 1  tablet twice a day by oral route as needed.    [provider]  Prenatal Vit-Fe Fumarate-FA (PRENATAL MULTIVITAMIN) TABS tablet Take 1 tablet by mouth daily at 12 noon.    [provider]  valACYclovir (VALTREX) 500 MG tablet Take 500 mg by mouth 2 (two) times daily. 04/08/23   [provider]    Family History History reviewed. No pertinent family history.  Social History Social History   Tobacco Use   Smoking status: Some Days    Current packs/day: 0.00    Types: Cigarettes    Last attempt to quit: 05/11/2020    Years since quitting: 3.5   Smokeless tobacco: Never  Vaping Use   Vaping status: Never Used  Substance Use Topics   Alcohol use: Not Currently   Drug use: No     Allergies   Patient has no known allergies.   Review of Systems Review of Systems  Constitutional:  Negative for fever.  Eyes:  Positive for pain. Negative for photophobia, discharge, redness, itching and visual disturbance.  All other systems reviewed and are negative.    Physical Exam Triage Vital Signs ED Triage Vitals  Encounter Vitals Group  BP 11/11/23 0903 135/86     Systolic BP Percentile --      Diastolic BP Percentile --      Pulse Rate 11/11/23 0903 87     Resp 11/11/23 0903 16     Temp 11/11/23 0903 98.5 F (36.9 C)     Temp Source 11/11/23 0903 Oral     SpO2 11/11/23 0903 98 %     Weight --      Height --      Head Circumference --      Peak Flow --      Pain Score 11/11/23 0904 8     Pain Loc --      Pain Education --      Exclude from Growth Chart --    No data found.  Updated Vital Signs BP 135/86 (BP Location: Right Arm)   Pulse 87   Temp 98.5 F (36.9 C) (Oral)   Resp 16   LMP 11/11/2023   SpO2 98%   Visual Acuity Right Eye Distance:   Left Eye Distance:   Bilateral Distance:    Right Eye Near:   Left Eye Near:    Bilateral Near:     Physical Exam Vitals and nursing note reviewed.  Constitutional:      General: She is  not in acute distress.    Appearance: She is well-developed and well-groomed.  HENT:     Head: Normocephalic and atraumatic.  Eyes:     General: Lids are everted, no foreign bodies appreciated. Vision grossly intact. Gaze aligned appropriately.        Left eye: Hordeolum present.    Conjunctiva/sclera: Conjunctivae normal.     Pupils: Pupils are equal, round, and reactive to light.   Cardiovascular:     Rate and Rhythm: Normal rate and regular rhythm.     Pulses: Normal pulses.     Heart sounds: Normal heart sounds. No murmur heard. Pulmonary:     Effort: Pulmonary effort is normal. No respiratory distress.     Breath sounds: Normal breath sounds and air entry.  Abdominal:     Palpations: Abdomen is soft.     Tenderness: There is no abdominal tenderness.  Musculoskeletal:        General: No swelling.     Cervical back: Neck supple.  Skin:    General: Skin is warm and dry.     Capillary Refill: Capillary refill takes less than 2 seconds.  Neurological:     General: No focal deficit present.     Mental Status: She is alert and oriented to person, place, and time.     GCS: GCS eye subscore is 4. GCS verbal subscore is 5. GCS motor subscore is 6.  Psychiatric:        Attention and Perception: Attention normal.        Mood and Affect: Mood normal.        Speech: Speech normal.        Behavior: Behavior normal. Behavior is cooperative.      UC Treatments / Results  Labs (all labs ordered are listed, but only abnormal results are displayed) Labs Reviewed - No data to display  EKG   Radiology No results found.  Procedures Procedures (including critical care time)  Medications Ordered in UC Medications - No data to display  Initial Impression / Assessment and Plan / UC Course  I have reviewed the triage vital signs and the nursing notes.  Pertinent labs & imaging  results that were available during my care of the patient were reviewed by me and considered in my  medical decision making (see chart for details).    Discussed exam findings and plan of care with patient, strict go to ER precautions given.   Patient verbalized understanding to this provider.  Ddx: Stye, Chalazion, conjunctivitis Final Clinical Impressions(s) / UC Diagnoses   Final diagnoses:  Hordeolum externum of left lower eyelid     Discharge Instructions      May use warm moist cotton balls wiping inner to outer eye to remove drainage from eyes,throw away,repeat. Wash hands before and after touching face,using eye medication. Do not rub eyes. Use eye medication as prescribed. Follow up with PCP/eye doctor if no improvement or worsening of symptoms-call for appointment.  Go to ER if you have vision changes.  Return as needed     ED Prescriptions     Medication Sig Dispense Auth. Provider   erythromycin ophthalmic ointment Place a 1/2 inch ribbon of ointment into the left lower eyelid every 6 hours x 5 days 3.5 g Ildefonso Keaney, Para March, NP      PDMP not reviewed this encounter.   Clancy Gourd, NP 11/11/23 [phone]

## 2023-11-11 NOTE — ED Triage Notes (Addendum)
Reports feeling an irritation in left lower eyelid x 3 weeks. Noticed a lump develop 1 week ago. Reports a burning and irritating sensation, mild localized swelling and watering. Denies itching, drainage, visual changes. Last time this happened, her eye doctor prescribed her some drops and it cleared up

## 2023-11-11 NOTE — Discharge Instructions (Signed)
 May use warm moist cotton balls wiping inner to outer eye to remove drainage from eyes,throw away,repeat. Wash hands before and after touching face,using eye medication. Do not rub eyes. Use eye medication as prescribed. Follow up with PCP/eye doctor if no improvement or worsening of symptoms-call for appointment.  Go to ER if you have vision changes.  Return as needed

## 2024-09-19 ENCOUNTER — Emergency Department (HOSPITAL_BASED_OUTPATIENT_CLINIC_OR_DEPARTMENT_OTHER)
Admission: EM | Admit: 2024-09-19 | Discharge: 2024-09-19 | Disposition: A | Discharge status: Home / Self Care | Attending: Emergency Medicine | Admitting: Emergency Medicine

## 2024-09-19 ENCOUNTER — Emergency Department (HOSPITAL_BASED_OUTPATIENT_CLINIC_OR_DEPARTMENT_OTHER)

## 2024-09-19 ENCOUNTER — Encounter (HOSPITAL_BASED_OUTPATIENT_CLINIC_OR_DEPARTMENT_OTHER): Payer: Self-pay | Admitting: Emergency Medicine

## 2024-09-19 DIAGNOSIS — M545 Low back pain, unspecified: Secondary | ICD-10-CM | POA: Insufficient documentation

## 2024-09-19 DIAGNOSIS — Y92410 Unspecified street and highway as the place of occurrence of the external cause: Secondary | ICD-10-CM | POA: Insufficient documentation

## 2024-09-19 DIAGNOSIS — Z720 Tobacco use: Secondary | ICD-10-CM | POA: Diagnosis not present

## 2024-09-19 DIAGNOSIS — M79631 Pain in right forearm: Secondary | ICD-10-CM | POA: Diagnosis not present

## 2024-09-19 DIAGNOSIS — M25511 Pain in right shoulder: Secondary | ICD-10-CM | POA: Diagnosis not present

## 2024-09-19 DIAGNOSIS — G8911 Acute pain due to trauma: Secondary | ICD-10-CM | POA: Insufficient documentation

## 2024-09-19 DIAGNOSIS — R000 Tachycardia, unspecified: Secondary | ICD-10-CM | POA: Diagnosis not present

## 2024-09-19 DIAGNOSIS — R0789 Other chest pain: Secondary | ICD-10-CM | POA: Diagnosis not present

## 2024-09-19 LAB — PREGNANCY, URINE: Preg Test, Ur: NEGATIVE

## 2024-09-19 MED ORDER — LIDOCAINE 5 % EX PTCH: 1.0000 | MEDICATED_PATCH | CUTANEOUS | 0 refills | Status: AC

## 2024-09-19 MED ORDER — METHOCARBAMOL 500 MG PO TABS: 500.0000 mg | ORAL_TABLET | Freq: Two times a day (BID) | ORAL | 0 refills | Status: AC | PRN

## 2024-09-19 MED ORDER — ACETAMINOPHEN 325 MG PO TABS
650.0000 mg | ORAL_TABLET | Freq: Once | ORAL | Status: AC
  Administered 2024-09-19: 650 mg via ORAL
  Filled 2024-09-19: qty 2

## 2024-09-19 MED ORDER — LIDOCAINE 5 % EX PTCH
1.0000 | MEDICATED_PATCH | CUTANEOUS | Status: DC
  Administered 2024-09-19: 1 via TRANSDERMAL
  Filled 2024-09-19: qty 1

## 2024-09-19 MED ORDER — METHOCARBAMOL 500 MG PO TABS
500.0000 mg | ORAL_TABLET | Freq: Once | ORAL | Status: AC
  Administered 2024-09-19: 500 mg via ORAL
  Filled 2024-09-19: qty 1
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
